# Patient Record
Sex: Female | Born: 2000 | Race: Black or African American | Hispanic: No | Smoking: Never smoker
Health system: Southern US, Community
[De-identification: ages and names within clinical notes are randomized; demographics above are authoritative.]

## PROBLEM LIST (undated history)

## (undated) DIAGNOSIS — R51 Headache: Secondary | ICD-10-CM

## (undated) DIAGNOSIS — R519 Headache, unspecified: Secondary | ICD-10-CM

## (undated) HISTORY — DX: Headache: R51

## (undated) HISTORY — DX: Headache, unspecified: R51.9

---
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
## 2019-02-10 DIAGNOSIS — Z113 Encounter for screening for infections with a predominantly sexual mode of transmission: Secondary | ICD-10-CM | POA: Diagnosis not present

## 2019-02-10 DIAGNOSIS — B373 Candidiasis of vulva and vagina: Secondary | ICD-10-CM | POA: Diagnosis not present

## 2019-03-08 DIAGNOSIS — F411 Generalized anxiety disorder: Secondary | ICD-10-CM | POA: Diagnosis not present

## 2019-04-22 DIAGNOSIS — Z20828 Contact with and (suspected) exposure to other viral communicable diseases: Secondary | ICD-10-CM | POA: Diagnosis not present

## 2019-04-28 DIAGNOSIS — Z01419 Encounter for gynecological examination (general) (routine) without abnormal findings: Secondary | ICD-10-CM | POA: Diagnosis not present

## 2019-05-03 ENCOUNTER — Ambulatory Visit
Admission: RE | Admit: 2019-05-03 | Discharge: 2019-05-03 | Disposition: A | Discharge status: Home / Self Care | Payer: BC Managed Care – PPO | Source: Ambulatory Visit | Attending: Internal Medicine | Admitting: Internal Medicine

## 2019-05-03 ENCOUNTER — Other Ambulatory Visit: Payer: Self-pay | Admitting: Internal Medicine

## 2019-05-03 DIAGNOSIS — R0781 Pleurodynia: Secondary | ICD-10-CM

## 2019-05-03 DIAGNOSIS — R079 Chest pain, unspecified: Secondary | ICD-10-CM | POA: Diagnosis not present

## 2019-05-10 DIAGNOSIS — R0789 Other chest pain: Secondary | ICD-10-CM | POA: Diagnosis not present

## 2019-05-22 DIAGNOSIS — F411 Generalized anxiety disorder: Secondary | ICD-10-CM | POA: Diagnosis not present

## 2019-06-07 DIAGNOSIS — F411 Generalized anxiety disorder: Secondary | ICD-10-CM | POA: Diagnosis not present

## 2020-07-01 IMAGING — DX DG CHEST 2V
2 series · 2 of 2 positions shown · non-contrast
Comparison: None.

CLINICAL DATA: 18-year-old female with a history of pleuritic chest
pain

EXAM:
CHEST - 2 VIEW

[dg chest 2 view (1 of 2)]
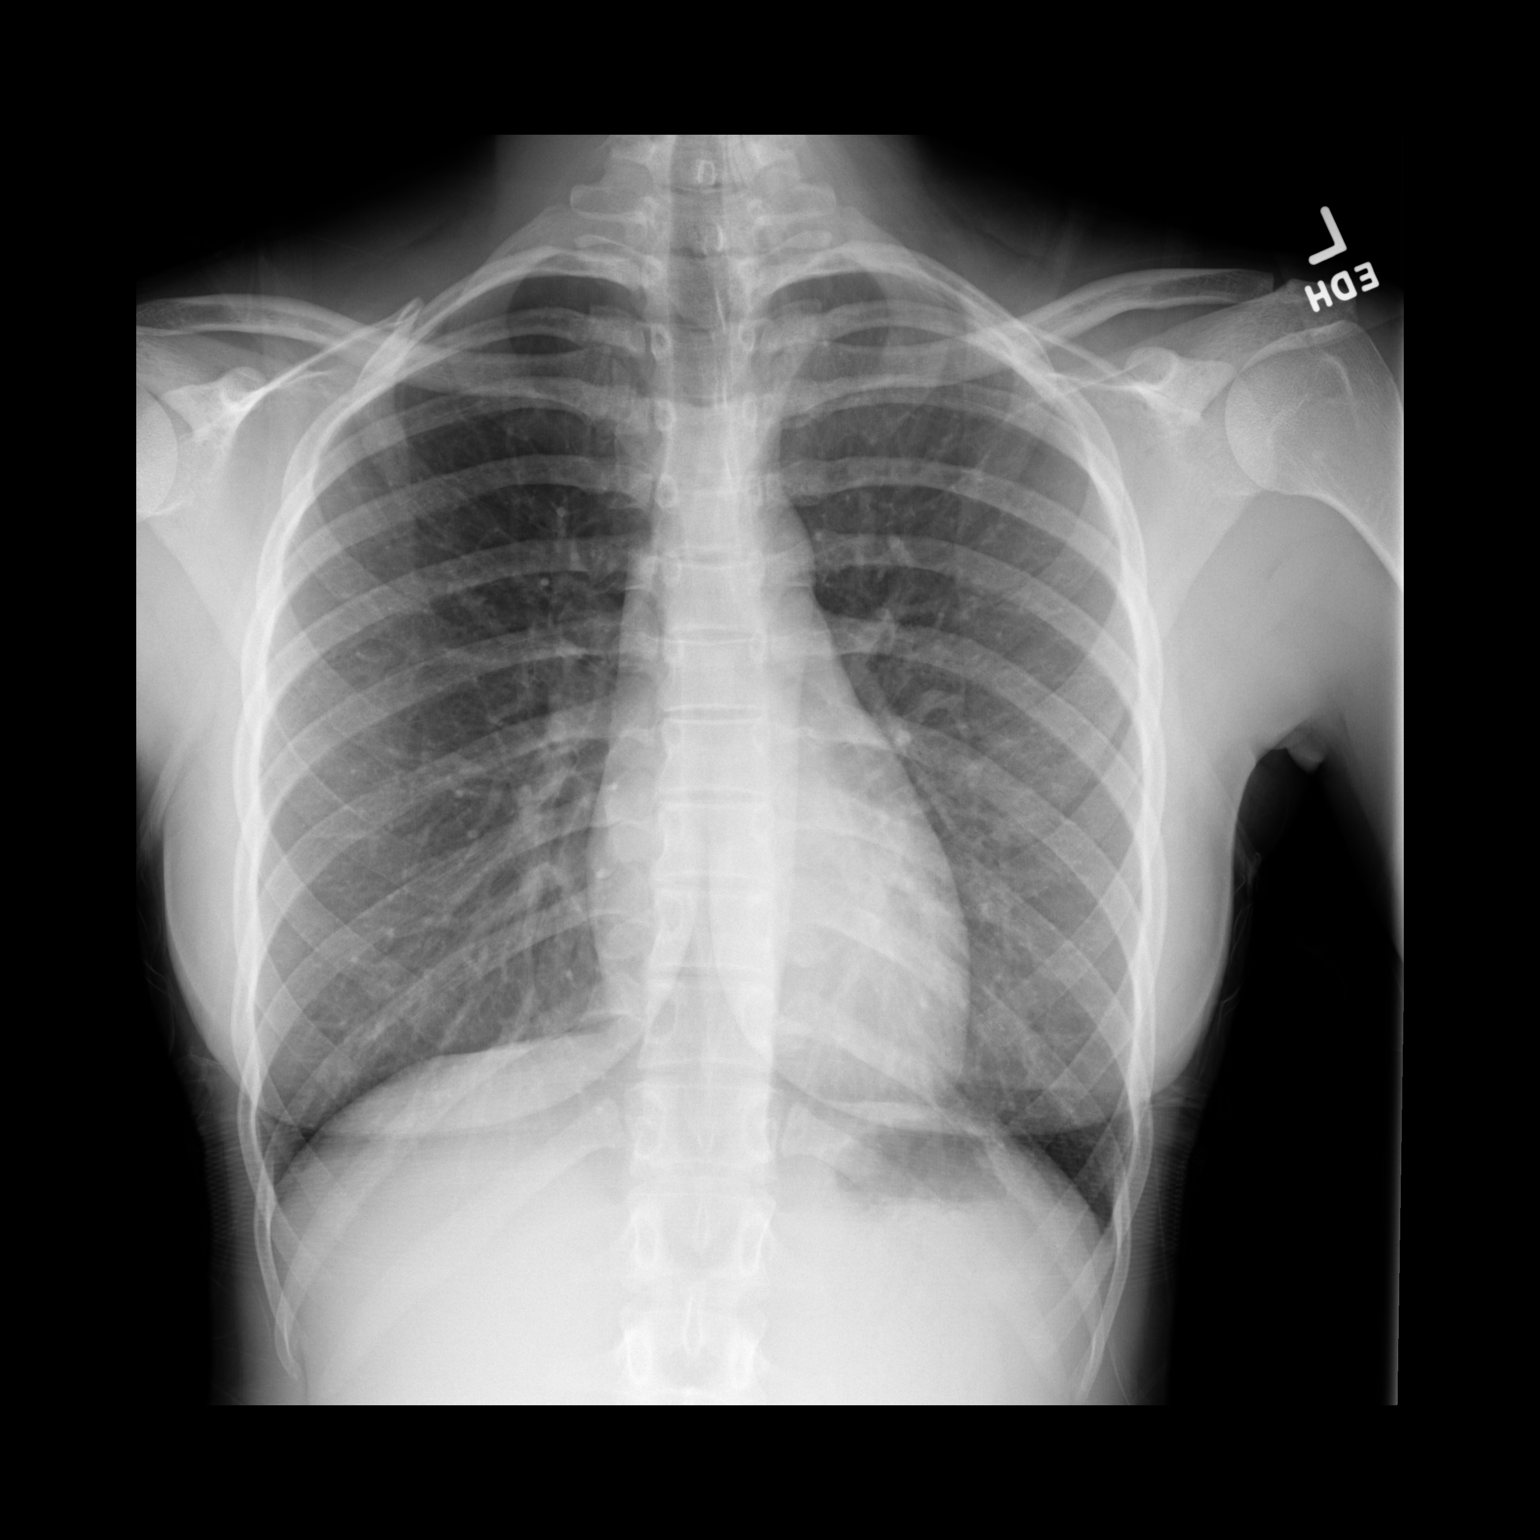

[dg chest 2 view (2 of 2)]
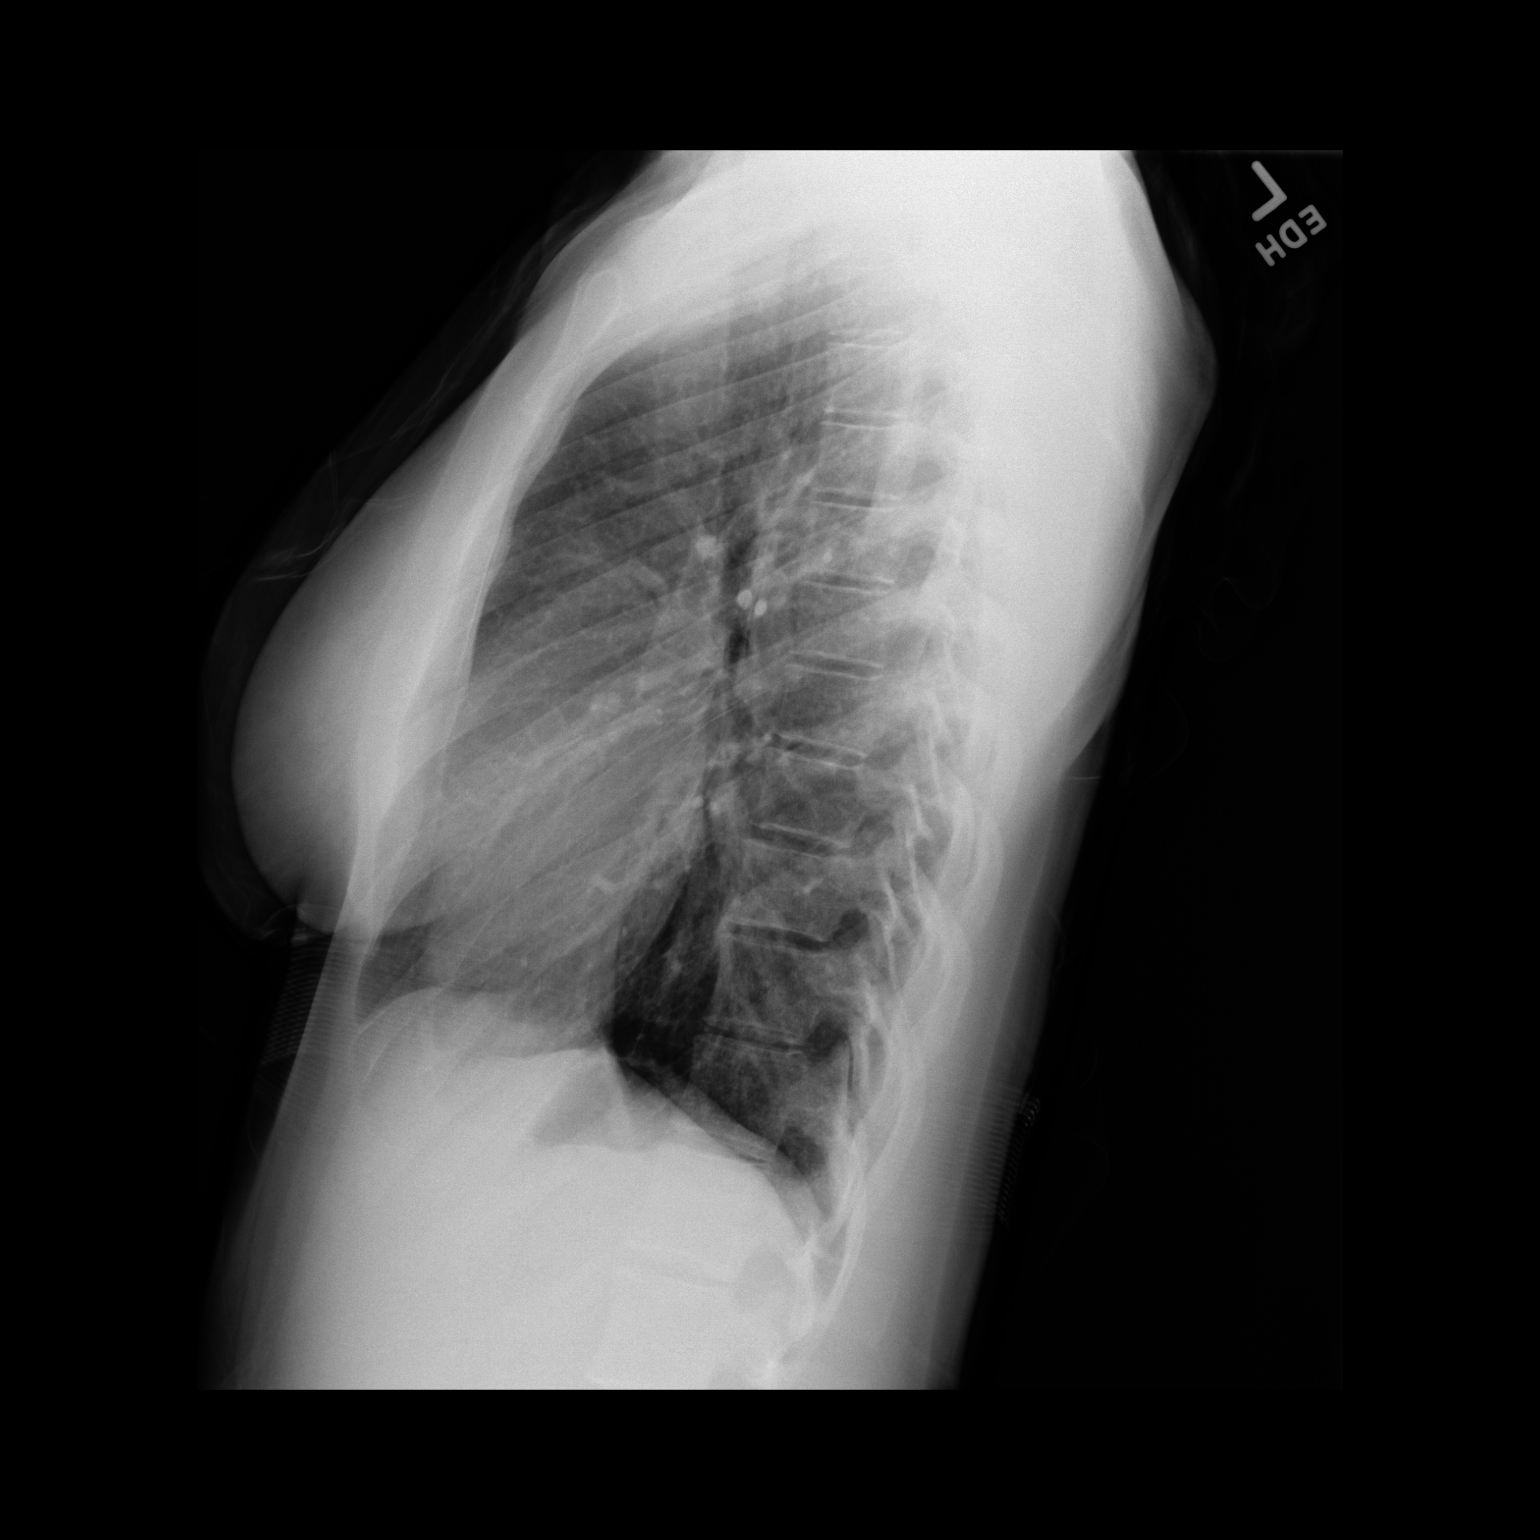

[2 of 2 positions shown; findings below may reference images not displayed]

FINDINGS: The heart size and mediastinal contours are within normal limits.
Both lungs are clear. The visualized skeletal structures are
unremarkable.
IMPRESSION: Negative for acute cardiopulmonary disease

## 2020-07-03 ENCOUNTER — Other Ambulatory Visit: Payer: BC Managed Care – PPO

## 2020-07-03 DIAGNOSIS — Z20822 Contact with and (suspected) exposure to covid-19: Secondary | ICD-10-CM | POA: Diagnosis not present

## 2020-07-04 LAB — SARS-COV-2, NAA 2 DAY TAT

## 2020-07-04 LAB — NOVEL CORONAVIRUS, NAA: SARS-CoV-2, NAA: NOT DETECTED

## 2020-07-10 DIAGNOSIS — Z113 Encounter for screening for infections with a predominantly sexual mode of transmission: Secondary | ICD-10-CM | POA: Diagnosis not present

## 2020-07-10 DIAGNOSIS — Z01419 Encounter for gynecological examination (general) (routine) without abnormal findings: Secondary | ICD-10-CM | POA: Diagnosis not present

## 2021-12-12 ENCOUNTER — Ambulatory Visit (HOSPITAL_COMMUNITY): Payer: Self-pay

## 2022-02-26 ENCOUNTER — Ambulatory Visit (HOSPITAL_COMMUNITY): Payer: Self-pay

## 2022-03-24 ENCOUNTER — Ambulatory Visit (HOSPITAL_COMMUNITY)
Admission: EM | Admit: 2022-03-24 | Discharge: 2022-03-24 | Disposition: A | Discharge status: Home / Self Care | Payer: Commercial Managed Care - HMO | Attending: Emergency Medicine | Admitting: Emergency Medicine

## 2022-03-24 ENCOUNTER — Encounter (HOSPITAL_COMMUNITY): Payer: Self-pay | Admitting: Emergency Medicine

## 2022-03-24 DIAGNOSIS — N898 Other specified noninflammatory disorders of vagina: Secondary | ICD-10-CM | POA: Diagnosis not present

## 2022-03-24 NOTE — ED Triage Notes (Signed)
Pt reports color is different than her normal vaginal discharge for couple weeks. Reports has a new sexual partner and wants STD testing.

## 2022-03-24 NOTE — Discharge Instructions (Signed)
Labs pending 2-3 days, you will be contacted if positive for any sti and treatment will be sent to the pharmacy, you will have to return to the clinic if positive for gonorrhea to receive treatment   Please refrain from having sex until labs results, if positive please refrain from having sex until treatment complete and symptoms resolve   If positive for  Chlamydia  gonorrhea or trichomoniasis please notify partner or partners so they may tested as well  Moving forward, it is recommended you use some form of protection against the transmission of sti infections  such as condoms or dental dams with each sexual encounter    

## 2022-03-24 NOTE — ED Provider Notes (Signed)
[facility]    CSN: [id number] Arrival date & time: 03/24/22  1611      History   Chief Complaint Chief Complaint  Patient presents with   Vaginal Discharge    HPI Bonnie Becker is a 21 y.o. female.   Patient presents with thick [place] vaginal discharge for 2 weeks, endorses this is a change from the normal presentation.  Symptoms started after unprotected sexual encounter with a new partner no known exposure.  Denies vaginal itching, odor, new rash or lesions, lower abdominal pain or pressure, flank pain, fever, chills or urinary symptoms.  Has not attempted treatment.    Past Medical History:  Diagnosis Date   Headache     Patient Active Problem List   Diagnosis Date Noted   Hair loss 03/27/2016   Chronic daily headache 07/03/2015    Past Surgical History:  Procedure Laterality Date   ADENOIDECTOMY AND MYRINGOTOMY WITH TUBE PLACEMENT  2005    OB History   No obstetric history on file.      Home Medications    Prior to Admission medications   Medication Sig Start Date End Date Taking? Authorizing Provider  acetaminophen (TYLENOL) 500 MG tablet Take 500 mg by mouth every 6 (six) hours as needed (Takes [phone] mg every 6 hour as needed.). Reported on 10/02/2015    [provider]  aspirin-acetaminophen-caffeine (EXCEDRIN MIGRAINE) [id number] MG tablet Take 1 tablet by mouth every 6 (six) hours as needed for headache (Taked 1-2 tabs every 6 hours as needed). Reported on 10/02/2015    [provider]  cetirizine (ZYRTEC) 10 MG tablet Take 10 mg by mouth daily as needed for allergies (Uses during allergy season only). Reported on 10/02/2015    [provider]  fluticasone (FLONASE) 50 MCG/ACT nasal spray Place 2 sprays into both nostrils daily as needed (Uses during allergy season only). Reported on 10/02/2015 01/02/15   [provider]  ibuprofen (ADVIL,MOTRIN) 200 MG tablet Take 600 mg by mouth every 6 (six) hours as needed.     [provider]  metroNIDAZOLE (FLAGYL) 500 MG tablet Take 1 tablet (500 mg total) by mouth 2 (two) times daily. 10/01/17   Moss, Amber, DO  promethazine (PHENERGAN) 12.5 MG tablet 1-2 tablets as needed every 6 hours Patient not taking: Reported on 03/20/2016 06/17/15   Elveria Rising, NP  propranolol ER (INDERAL LA) 60 MG 24 hr capsule Take 1 capsule (60 mg total) by mouth daily. 10/02/15   Margurite Auerbach, MD    Family History Family History  Problem Relation Age of Onset   Depression Maternal Grandmother    Anxiety disorder Maternal Grandmother    Congestive Heart Failure Paternal Grandmother     Social History Social History   Tobacco Use   Smoking status: Never   Smokeless tobacco: Never  Substance Use Topics   Alcohol use: No   Drug use: No     Allergies   Other   Review of Systems Review of Systems  Constitutional: Negative.   Respiratory: Negative.    Cardiovascular: Negative.   Genitourinary:  Positive for vaginal discharge. Negative for decreased urine volume, difficulty urinating, dyspareunia, dysuria, enuresis, flank pain, frequency, genital sores, hematuria, menstrual problem, pelvic pain, urgency, vaginal bleeding and vaginal pain.  Skin: Negative.      Physical Exam Triage Vital Signs ED Triage Vitals  Enc Vitals Group     BP 03/24/22 1637 116/72     Pulse Rate 03/24/22 1637 90  Resp 03/24/22 1637 17     Temp 03/24/22 1637 98 F (36.7 C)     Temp src --      SpO2 03/24/22 1637 99 %     Weight --      Height --      Head Circumference --      Peak Flow --      Pain Score 03/24/22 1636 0     Pain Loc --      Pain Edu? --      Excl. in [place]? --    No data found.  Updated Vital Signs BP 116/72 (BP Location: Left Arm)   Pulse 90   Temp 98 F (36.7 C)   Resp 17   LMP 03/02/2022 (Exact Date)   SpO2 99%   Visual Acuity Right Eye Distance:   Left Eye Distance:   Bilateral Distance:    Right Eye Near:   Left Eye Near:     Bilateral Near:     Physical Exam Constitutional:      Appearance: Normal appearance.  Eyes:     Extraocular Movements: Extraocular movements intact.  Pulmonary:     Effort: Pulmonary effort is normal.  Genitourinary:    Comments: deferred Skin:    General: Skin is warm and dry.  Neurological:     Mental Status: She is alert and oriented to person, place, and time. Mental status is at baseline.  Psychiatric:        Mood and Affect: Mood normal.        Behavior: Behavior normal.      UC Treatments / Results  Labs (all labs ordered are listed, but only abnormal results are displayed) Labs Reviewed  CERVICOVAGINAL ANCILLARY ONLY    EKG   Radiology No results found.  Procedures Procedures (including critical care time)  Medications Ordered in UC Medications - No data to display  Initial Impression / Assessment and Plan / UC Course  I have reviewed the triage vital signs and the nursing notes.  Pertinent labs & imaging results that were available during my care of the patient were reviewed by me and considered in my medical decision making (see chart for details).  Vaginal discharge   STI labs pending will treat per protocol, declined HIV and syphilis testing, advised abstinence until lab results, and/or treatment is complete, advised condom use during all sexual encounters moving, may follow-up with urgent care as needed  Final Clinical Impressions(s) / UC Diagnoses   Final diagnoses:  None   Discharge Instructions   None    ED Prescriptions   None    PDMP not reviewed this encounter.   Hans Eden, NP 03/24/22 1652

## 2022-03-25 LAB — CERVICOVAGINAL ANCILLARY ONLY
Bacterial Vaginitis (gardnerella): NEGATIVE
Candida Glabrata: NEGATIVE
Candida Vaginitis: POSITIVE — AB
Chlamydia: NEGATIVE
Comment: NEGATIVE
Comment: NEGATIVE
Comment: NEGATIVE
Comment: NEGATIVE
Comment: NEGATIVE
Comment: NORMAL
Neisseria Gonorrhea: NEGATIVE
Trichomonas: NEGATIVE

## 2022-03-26 ENCOUNTER — Telehealth (HOSPITAL_COMMUNITY): Payer: Self-pay | Admitting: Emergency Medicine

## 2022-03-26 MED ORDER — FLUCONAZOLE 150 MG PO TABS: 150.0000 mg | ORAL_TABLET | Freq: Once | ORAL | 0 refills | Status: AC

## 2022-06-13 ENCOUNTER — Ambulatory Visit (HOSPITAL_COMMUNITY): Payer: Self-pay

## 2022-07-07 ENCOUNTER — Encounter (HOSPITAL_COMMUNITY): Payer: Self-pay

## 2022-07-07 ENCOUNTER — Ambulatory Visit (HOSPITAL_COMMUNITY)
Admission: RE | Admit: 2022-07-07 | Discharge: 2022-07-07 | Disposition: A | Discharge status: Home / Self Care | Payer: Commercial Managed Care - HMO | Source: Ambulatory Visit | Attending: Physician Assistant | Admitting: Physician Assistant

## 2022-07-07 VITALS — BP 118/73 | HR 89 | Temp 98.7°F | Resp 18

## 2022-07-07 DIAGNOSIS — N898 Other specified noninflammatory disorders of vagina: Secondary | ICD-10-CM | POA: Diagnosis present

## 2022-07-07 DIAGNOSIS — Z113 Encounter for screening for infections with a predominantly sexual mode of transmission: Secondary | ICD-10-CM | POA: Diagnosis not present

## 2022-07-07 NOTE — Discharge Instructions (Signed)
Monitor your MyChart for your results.  We will contact you if we need to arrange any treatment.  Wear loosefitting cotton underwear and use hypoallergenic soaps and detergents.  If you have any additional or changing symptoms including pelvic pain, abdominal pain, fever, nausea, vomiting you need to be seen immediately.

## 2022-07-07 NOTE — ED Provider Notes (Signed)
[facility]    CSN: [id number] Arrival date & time: 07/07/22  1451      History   Chief Complaint Chief Complaint  Patient presents with   Vaginal Discharge   Vaginal Itching   SEXUALLY TRANSMITTED DISEASE    HPI Bonnie Becker is a 22 y.o. female.   Patient presents today requesting STI testing.  She reports that for the past several days she has had thick white vaginal discharge with associated itching.  She reached out to her OB/GYN who sent in a dose of Diflucan.  The itching has improved.  She has previously been treated with metronidazole and boric acid suppositories.  Denies any recent antibiotics, changes to personal hygiene products, soaps, detergents.  She is no specific concern for STI but is requesting testing to ensure that yeast is the cause of her symptoms.  She declined HIV/hepatitis/syphilis testing today.  Denies history of diabetes or immunosuppression.  She does not take SGLT2 inhibitor.    Past Medical History:  Diagnosis Date   Headache     Patient Active Problem List   Diagnosis Date Noted   Hair loss 03/27/2016   Chronic daily headache 07/03/2015    Past Surgical History:  Procedure Laterality Date   ADENOIDECTOMY AND MYRINGOTOMY WITH TUBE PLACEMENT  2005    OB History   No obstetric history on file.      Home Medications    Prior to Admission medications   Medication Sig Start Date End Date Taking? Authorizing Provider  fluconazole (DIFLUCAN) 150 MG tablet Take 150 mg by mouth every 3 (three) days. 07/06/22  Yes [provider]  JUNEL FE 1.5/30 1.5-30 MG-MCG tablet Take 1 tablet by mouth daily. 07/03/22  Yes [provider]    Family History Family History  Problem Relation Age of Onset   Depression Maternal Grandmother    Anxiety disorder Maternal Grandmother    Congestive Heart Failure Paternal Grandmother     Social History Social History   Tobacco Use   Smoking status: Never   Smokeless tobacco:  Never  Vaping Use   Vaping Use: Every day  Substance Use Topics   Alcohol use: No   Drug use: No     Allergies   Other   Review of Systems Review of Systems  Constitutional:  Negative for activity change, appetite change, fatigue and fever.  Gastrointestinal:  Negative for abdominal pain, diarrhea, nausea and vomiting.  Genitourinary:  Positive for vaginal discharge. Negative for dysuria, frequency, urgency, vaginal bleeding and vaginal pain.     Physical Exam Triage Vital Signs ED Triage Vitals  Enc Vitals Group     BP 07/07/22 1642 118/73     Pulse Rate 07/07/22 1642 89     Resp 07/07/22 1642 18     Temp 07/07/22 1642 98.7 F (37.1 C)     Temp Source 07/07/22 1642 Oral     SpO2 07/07/22 1642 97 %     Weight --      Height --      Head Circumference --      Peak Flow --      Pain Score 07/07/22 1640 0     Pain Loc --      Pain Edu? --      Excl. in [place]? --    No data found.  Updated Vital Signs BP 118/73 (BP Location: Left Arm)   Pulse 89   Temp 98.7 F (37.1 C) (Oral)   Resp  18   LMP 06/28/2022 (Exact Date)   SpO2 97%   Visual Acuity Right Eye Distance:   Left Eye Distance:   Bilateral Distance:    Right Eye Near:   Left Eye Near:    Bilateral Near:     Physical Exam Vitals reviewed.  Constitutional:      General: She is awake. She is not in acute distress.    Appearance: Normal appearance. She is well-developed. She is not ill-appearing.     Comments: Very pleasant female appears stated age in no acute distress sitting comfortably in exam room  HENT:     Head: Normocephalic and atraumatic.  Cardiovascular:     Rate and Rhythm: Normal rate and regular rhythm.     Heart sounds: Normal heart sounds, S1 normal and S2 normal. No murmur heard. Pulmonary:     Effort: Pulmonary effort is normal.     Breath sounds: Normal breath sounds. No wheezing, rhonchi or rales.     Comments: Clear to auscultation bilaterally Abdominal:     General: Bowel  sounds are normal.     Palpations: Abdomen is soft.     Tenderness: There is no abdominal tenderness. There is no right CVA tenderness, left CVA tenderness, guarding or rebound.     Comments: Benign abdominal exam  Psychiatric:        Behavior: Behavior is cooperative.      UC Treatments / Results  Labs (all labs ordered are listed, but only abnormal results are displayed) Labs Reviewed  CERVICOVAGINAL ANCILLARY ONLY    EKG   Radiology No results found.  Procedures Procedures (including critical care time)  Medications Ordered in UC Medications - No data to display  Initial Impression / Assessment and Plan / UC Course  I have reviewed the triage vital signs and the nursing notes.  Pertinent labs & imaging results that were available during my care of the patient were reviewed by me and considered in my medical decision making (see chart for details).     Patient is well-appearing, afebrile, nontoxic, nontachycardic.  She has already been treated for yeast.  STI swab was collected today and is pending.  If she has any additional positive results on her swab we will contact her to arrange treatment.  Encouraged her to use hypoallergenic soaps and detergents and wear loosefitting cotton underwear.  She is to follow-up regularly with her OB/GYN.  Discussed that if she has any changing or worsening symptoms including abdominal pain, pelvic pain, change in discharge, fever, nausea, vomiting she needs to be seen immediately.  Strict return precautions given.  Final Clinical Impressions(s) / UC Diagnoses   Final diagnoses:  Vaginal discharge  Routine screening for STI (sexually transmitted infection)     Discharge Instructions      Monitor your MyChart for your results.  We will contact you if we need to arrange any treatment.  Wear loosefitting cotton underwear and use hypoallergenic soaps and detergents.  If you have any additional or changing symptoms including pelvic  pain, abdominal pain, fever, nausea, vomiting you need to be seen immediately.     ED Prescriptions   None    PDMP not reviewed this encounter.   Jeani Hawking, PA-C 07/07/22 1714

## 2022-07-07 NOTE — ED Triage Notes (Signed)
Pt states that she has vaginal discharge and itching since Saturday. She called her gyn and she called her in diflucan for yeast infection she used yesterday. She has no know exposure but she would like STI testing.

## 2022-07-08 LAB — CERVICOVAGINAL ANCILLARY ONLY
Bacterial Vaginitis (gardnerella): NEGATIVE
Candida Glabrata: NEGATIVE
Candida Vaginitis: NEGATIVE
Chlamydia: NEGATIVE
Comment: NEGATIVE
Comment: NEGATIVE
Comment: NEGATIVE
Comment: NEGATIVE
Comment: NEGATIVE
Comment: NORMAL
Neisseria Gonorrhea: NEGATIVE
Trichomonas: NEGATIVE

## 2022-10-27 ENCOUNTER — Ambulatory Visit (HOSPITAL_COMMUNITY): Payer: Self-pay

## 2022-11-24 ENCOUNTER — Ambulatory Visit (HOSPITAL_COMMUNITY): Payer: Self-pay

## 2023-01-17 ENCOUNTER — Ambulatory Visit (HOSPITAL_COMMUNITY)
Admission: RE | Admit: 2023-01-17 | Discharge: 2023-01-17 | Disposition: A | Discharge status: Home / Self Care | Payer: Commercial Managed Care - HMO | Source: Ambulatory Visit | Attending: Family Medicine | Admitting: Family Medicine

## 2023-01-17 ENCOUNTER — Encounter (HOSPITAL_COMMUNITY): Payer: Self-pay

## 2023-01-17 VITALS — BP 112/75 | HR 75 | Temp 98.5°F | Resp 15

## 2023-01-17 DIAGNOSIS — N760 Acute vaginitis: Secondary | ICD-10-CM | POA: Diagnosis present

## 2023-01-17 DIAGNOSIS — Z202 Contact with and (suspected) exposure to infections with a predominantly sexual mode of transmission: Secondary | ICD-10-CM | POA: Diagnosis not present

## 2023-01-17 NOTE — ED Triage Notes (Signed)
Reports that started menstrual cycle this past Monday. Reports sometimes gets rash with pads. Put cream on rash and went away. Friday and yesterday had itching in vaginal area. Pt requesting STD testing.

## 2023-01-17 NOTE — ED Provider Notes (Signed)
[facility]    CSN: [id number] Arrival date & time: 01/17/23  1243      History   Chief Complaint Chief Complaint  Patient presents with   Exposure to STD    Entered by patient    HPI Bonnie Becker is a 22 y.o. female.   The history is provided by the patient. No language interpreter was used.  Exposure to STD This is a new (The patient had a sexual encounter with her ex-boyfriend one week ago and a few days later, after which she felt some irritation in her vulva.) problem. The problem occurs constantly. The problem has been rapidly improving. Pertinent negatives include no abdominal pain. Associated symptoms comments: She denies any symptoms at the moment. No vulva itching, burning, or vaginal discharge. She wants to be screened for STDs.. Relieved by: She used topical Monistat cream on the lesion, with some improvement.    Past Medical History:  Diagnosis Date   Headache     Patient Active Problem List   Diagnosis Date Noted   Hair loss 03/27/2016   Chronic daily headache 07/03/2015    Past Surgical History:  Procedure Laterality Date   ADENOIDECTOMY AND MYRINGOTOMY WITH TUBE PLACEMENT  2005    OB History   No obstetric history on file.      Home Medications    Prior to Admission medications   Medication Sig Start Date End Date Taking? Authorizing Provider  fluconazole (DIFLUCAN) 150 MG tablet Take 150 mg by mouth every 3 (three) days. 07/06/22   [provider]  JUNEL FE 1.5/30 1.5-30 MG-MCG tablet Take 1 tablet by mouth daily. 07/03/22   [provider]    Family History Family History  Problem Relation Age of Onset   Depression Maternal Grandmother    Anxiety disorder Maternal Grandmother    Congestive Heart Failure Paternal Grandmother     Social History Social History   Tobacco Use   Smoking status: Never   Smokeless tobacco: Never  Vaping Use   Vaping status: Every Day  Substance Use Topics   Alcohol use: No    Drug use: No     Allergies   Other   Review of Systems Review of Systems  Gastrointestinal:  Negative for abdominal pain.  All other systems reviewed and are negative.    Physical Exam Triage Vital Signs ED Triage Vitals [01/17/23 1310]  Encounter Vitals Group     BP 112/75     Systolic BP Percentile      Diastolic BP Percentile      Pulse Rate 75     Resp 15     Temp 98.5 F (36.9 C)     Temp Source Oral     SpO2 98 %     Weight      Height      Head Circumference      Peak Flow      Pain Score 0     Pain Loc      Pain Education      Exclude from Growth Chart    No data found.  Updated Vital Signs BP 112/75 (BP Location: Left Arm)   Pulse 75   Temp 98.5 F (36.9 C) (Oral)   Resp 15   LMP 01/11/2023   SpO2 98%   Visual Acuity Right Eye Distance:   Left Eye Distance:   Bilateral Distance:    Right Eye Near:   Left Eye Near:    Bilateral  Near:     Physical Exam Vitals and nursing note reviewed.  Cardiovascular:     Rate and Rhythm: Normal rate and regular rhythm.     Heart sounds: Normal heart sounds. No murmur heard. Pulmonary:     Effort: Pulmonary effort is normal. No respiratory distress.     Breath sounds: Normal breath sounds. No wheezing.  Genitourinary:    Comments: Declined GU exam She completed self swab     UC Treatments / Results  Labs (all labs ordered are listed, but only abnormal results are displayed) Labs Reviewed  CERVICOVAGINAL ANCILLARY ONLY    EKG   Radiology No results found.  Procedures Procedures (including critical care time)  Medications Ordered in UC Medications - No data to display  Initial Impression / Assessment and Plan / UC Course  I have reviewed the triage vital signs and the nursing notes.  Pertinent labs & imaging results that were available during my care of the patient were reviewed by me and considered in my medical decision making (see chart for details).     Vulvo-vaginitis: STD  screening requested and was completed - GC/Chlam, Trich, Herpes BV and Yeast also tested. HIV and RPR offered, but she declined. We will contact her with test results soon. Instructions on STD prevention was provided.    Final Clinical Impressions(s) / UC Diagnoses   Final diagnoses:  Possible exposure to STD  Vaginitis and vulvovaginitis     Discharge Instructions        It was nice seeing you today. I will contact you soon with all test results.      ED Prescriptions   None    PDMP not reviewed this encounter.   Doreene Eland, MD 01/17/23 1335

## 2023-01-17 NOTE — Discharge Instructions (Addendum)
   It was nice seeing you today. I will contact you soon with all test results.

## 2023-01-18 ENCOUNTER — Telehealth: Payer: Self-pay | Admitting: Family Medicine

## 2023-01-18 LAB — CERVICOVAGINAL ANCILLARY ONLY
Bacterial Vaginitis (gardnerella): NEGATIVE
Candida Glabrata: NEGATIVE
Candida Vaginitis: POSITIVE — AB
Chlamydia: NEGATIVE
Comment: NEGATIVE
Comment: NEGATIVE
Comment: NEGATIVE
Comment: NEGATIVE
Comment: NEGATIVE
Comment: NORMAL
Neisseria Gonorrhea: NEGATIVE
Trichomonas: NEGATIVE

## 2023-01-18 MED ORDER — FLUCONAZOLE 150 MG PO TABS: 150.0000 mg | ORAL_TABLET | Freq: Once | ORAL | 0 refills | Status: AC

## 2023-01-18 NOTE — Telephone Encounter (Signed)
Test result discussed.  Positive Candida infection - Diflucan escribed She has no further questions.

## 2023-02-03 ENCOUNTER — Emergency Department (HOSPITAL_COMMUNITY)
Admission: EM | Admit: 2023-02-03 | Discharge: 2023-02-03 | Disposition: A | Discharge status: Home / Self Care | Payer: Commercial Managed Care - HMO | Attending: Emergency Medicine | Admitting: Emergency Medicine

## 2023-02-03 DIAGNOSIS — J358 Other chronic diseases of tonsils and adenoids: Secondary | ICD-10-CM | POA: Insufficient documentation

## 2023-02-03 DIAGNOSIS — J029 Acute pharyngitis, unspecified: Secondary | ICD-10-CM | POA: Diagnosis present

## 2023-02-03 NOTE — ED Triage Notes (Signed)
Pt states that approximately 30 minutes ago she was trying to remove a tonsil stone and had some bleeding thereafter. Pt states she can still taste some blood, but no profuse bleeding noted. No airway issues. Pt denies pain.

## 2023-02-03 NOTE — Discharge Instructions (Addendum)
Thank you for allowing [place] to be a part of your care today.  You were evaluated for bleeding of your tonsil after attempting to remove a stone.   Your exam is overall reassuring.  Your left tonsil appears irritated, but is not swollen and is no longer bleeding.  I recommend not attempting to remove these using sharp objects, but you may try using a cotton swab.   I also recommend doing warm saltwater gargles a few times during the day to help with healing.    I have provided ENT referral information for follow up should you continue to have issues with your tonsils.   Return to the ED if you develop sudden worsening of your symptoms, have severe bleeding, difficulty swallowing, or if you have any new concerns.

## 2023-02-03 NOTE — ED Provider Notes (Signed)
[place] EMERGENCY DEPARTMENT AT [facility] Provider Note   CSN: [id number] Arrival date & time: 02/03/23  0518     History  Chief Complaint  Patient presents with   Sore Throat    Bonnie Becker is a 22 y.o. female presents to the ED with concern of a bleeding tonsil after attempting to remove a tonsil stone using a comb.  She states that she had some bleeding afterwards and became very worried.  Prior to ED arrival, she felt like she could still taste some blood.  She states she gets tonsil stones frequently on both sides.  Denies difficulty breathing, swallowing, or pain.         Home Medications Prior to Admission medications   Medication Sig Start Date End Date Taking? Authorizing Provider  JUNEL FE 1.5/30 1.5-30 MG-MCG tablet Take 1 tablet by mouth daily. 07/03/22   [provider]      Allergies    Other    Review of Systems   Review of Systems  HENT:  Negative for sore throat and trouble swallowing.   Respiratory:  Negative for choking and shortness of breath.     Physical Exam Updated Vital Signs BP 129/88 (BP Location: Left Arm)   Pulse (!) 110   Temp 98.7 F (37.1 C) (Oral)   Resp 16   LMP 01/11/2023   SpO2 100%  Physical Exam Vitals and nursing note reviewed.  Constitutional:      General: She is not in acute distress.    Appearance: Normal appearance. She is not ill-appearing or diaphoretic.  HENT:     Mouth/Throat:     Lips: Pink.     Mouth: Mucous membranes are moist.     Palate: No lesions.     Pharynx: Oropharynx is clear. Uvula midline. No pharyngeal swelling or posterior oropharyngeal erythema.     Tonsils: No tonsillar exudate or tonsillar abscesses.     Comments: Slightly enlarged blood vessel on the anterior surface of the left tonsil without active bleeding.  Tonsils do not appear enlarged.   Cardiovascular:     Rate and Rhythm: Normal rate and regular rhythm.  Pulmonary:     Effort: Pulmonary effort is normal.   Skin:    General: Skin is warm and dry.     Capillary Refill: Capillary refill takes less than 2 seconds.  Neurological:     Mental Status: She is alert. Mental status is at baseline.  Psychiatric:        Mood and Affect: Mood normal.        Behavior: Behavior normal.     ED Results / Procedures / Treatments   Labs (all labs ordered are listed, but only abnormal results are displayed) Labs Reviewed - No data to display  EKG None  Radiology No results found.  Procedures Procedures    Medications Ordered in ED Medications - No data to display  ED Course/ Medical Decision Making/ A&P                                 Medical Decision Making  This patient presents to the ED with chief complaint(s) of bleeding tonsil with pertinent past medical history of tonsil stones.  The complaint involves an extensive differential diagnosis and also carries with it a high risk of complications and morbidity.    Initial Assessment:   Slightly enlarged blood vessel on the anterior surface  of the left tonsil without active bleeding.  Tonsils do not appear enlarged. Patient is able to speak and swallow without difficulty.  Tonsil stone minimally visible.   Disposition:   Patient's exam is overall reassuring.  She does not have active bleeding.  Discussed supportive care measures including using warm saltwater gargles to help with symptoms.  Provided patient ENT referral.  Discussed not using sharp objects to attempt to remove tonsil stones to prevent further injury.   The patient has been appropriately medically screened and/or stabilized in the ED. I have low suspicion for any other emergent medical condition which would require further screening, evaluation or treatment in the ED or require inpatient management. At time of discharge the patient is hemodynamically stable and in no acute distress. I have discussed work-up results and diagnosis with patient and answered all questions. Patient is  agreeable with discharge plan. We discussed strict return precautions for returning to the emergency department and they verbalized understanding.            Final Clinical Impression(s) / ED Diagnoses Final diagnoses:  Tonsil stone    Rx / DC Orders ED Discharge Orders     None         Lenard Simmer, PA-C 02/03/23 0615    Sabas Sous, MD 02/03/23 [phone]

## 2023-02-16 ENCOUNTER — Other Ambulatory Visit: Payer: Self-pay | Admitting: Family Medicine

## 2023-03-24 ENCOUNTER — Emergency Department (HOSPITAL_COMMUNITY)
Admission: EM | Admit: 2023-03-24 | Discharge: 2023-03-25 | Disposition: A | Discharge status: Home / Self Care | Payer: Commercial Managed Care - HMO | Attending: Emergency Medicine | Admitting: Emergency Medicine

## 2023-03-24 DIAGNOSIS — Z5321 Procedure and treatment not carried out due to patient leaving prior to being seen by health care provider: Secondary | ICD-10-CM | POA: Diagnosis not present

## 2023-03-24 DIAGNOSIS — F410 Panic disorder [episodic paroxysmal anxiety] without agoraphobia: Secondary | ICD-10-CM | POA: Diagnosis present

## 2023-03-24 NOTE — ED Triage Notes (Signed)
Patient states she thinks maybe she had a panic attack, has had one in the past, and it kind of felt like that.

## 2023-03-24 NOTE — ED Triage Notes (Signed)
Patient states she was riding in a car as passenger and states she started feeling weird, complained of left side of face started feeling funny, no longer feels that way. Has a little bit of tingling around her face now. Also states she started having heart palpitations. Denies chest pain, headache,or any other pain. 0/10

## 2023-04-05 ENCOUNTER — Other Ambulatory Visit: Payer: Self-pay

## 2023-04-05 ENCOUNTER — Emergency Department (HOSPITAL_COMMUNITY)
Admission: EM | Admit: 2023-04-05 | Discharge: 2023-04-06 | Disposition: A | Discharge status: Home / Self Care | Payer: Commercial Managed Care - HMO | Attending: Emergency Medicine | Admitting: Emergency Medicine

## 2023-04-05 ENCOUNTER — Encounter (HOSPITAL_COMMUNITY): Payer: Self-pay

## 2023-04-05 ENCOUNTER — Emergency Department (HOSPITAL_COMMUNITY): Payer: Commercial Managed Care - HMO

## 2023-04-05 DIAGNOSIS — R002 Palpitations: Secondary | ICD-10-CM | POA: Diagnosis not present

## 2023-04-05 DIAGNOSIS — R079 Chest pain, unspecified: Secondary | ICD-10-CM | POA: Diagnosis present

## 2023-04-05 LAB — CBC
HCT: 42.5 % (ref 36.0–46.0)
Hemoglobin: 14.3 g/dL (ref 12.0–15.0)
MCH: 30.2 pg (ref 26.0–34.0)
MCHC: 33.6 g/dL (ref 30.0–36.0)
MCV: 89.9 fL (ref 80.0–100.0)
Platelets: 307 10*3/uL (ref 150–400)
RBC: 4.73 MIL/uL (ref 3.87–5.11)
RDW: 12.4 % (ref 11.5–15.5)
WBC: 8.7 10*3/uL (ref 4.0–10.5)
nRBC: 0 % (ref 0.0–0.2)

## 2023-04-05 LAB — BASIC METABOLIC PANEL
Anion gap: 10 (ref 5–15)
BUN: 12 mg/dL (ref 6–20)
CO2: 22 mmol/L (ref 22–32)
Calcium: 9.1 mg/dL (ref 8.9–10.3)
Chloride: 105 mmol/L (ref 98–111)
Creatinine, Ser: 1.05 mg/dL — ABNORMAL HIGH (ref 0.44–1.00)
GFR, Estimated: >60 mL/min (ref >60)
Glucose, Bld: 102 mg/dL — ABNORMAL HIGH (ref 70–99)
Potassium: 3.7 mmol/L (ref 3.5–5.1)
Sodium: 137 mmol/L (ref 135–145)

## 2023-04-05 LAB — D-DIMER, QUANTITATIVE: D-Dimer, Quant: 0.36 ug{FEU}/mL (ref 0.00–0.50)

## 2023-04-05 LAB — TROPONIN I (HIGH SENSITIVITY): Troponin I (High Sensitivity): <2 ng/L (ref <18)

## 2023-04-05 LAB — HCG, SERUM, QUALITATIVE: Preg, Serum: NEGATIVE

## 2023-04-05 NOTE — ED Provider Triage Note (Signed)
Emergency Medicine Provider Triage Evaluation Note  Bonnie Becker , a 22 y.o. female  was evaluated in triage.  Pt complains of chest pain.  Patient has had persistent chest pain for the past 2 and half hours with racing heart and left arm tightness.  She was evaluated for similar symptoms recently and returns for reevaluation today.  Review of Systems  Positive: Chest pain Negative: Vomiting or diaphoresis  Physical Exam  BP 129/76 (BP Location: Left Arm)   Pulse (!) 103   Temp 98.2 F (36.8 C) (Oral)   Resp 18   SpO2 99%  Gen:   Awake, no distress   Resp:  Normal effort  MSK:   Moves extremities without difficulty  Other:  tachycardic  Medical Decision Making  Medically screening exam initiated at 10:48 PM.  Appropriate orders placed.  Bonnie Becker was informed that the remainder of the evaluation will be completed by another provider, this initial triage assessment does not replace that evaluation, and the importance of remaining in the ED until their evaluation is complete.  On OCP D-dimer, tsh   Arthor Captain, PA-C 04/05/23 2255

## 2023-04-05 NOTE — ED Triage Notes (Signed)
Pt arrived from home via POV c./o left arm tightness that begins at shoulder to wrist. 6/10 on pain scale.

## 2023-04-06 LAB — TROPONIN I (HIGH SENSITIVITY): Troponin I (High Sensitivity): <2 ng/L (ref <18)

## 2023-04-06 LAB — TSH: TSH: 3.238 u[IU]/mL (ref 0.350–4.500)

## 2023-04-06 MED ORDER — HYDROXYZINE HCL 25 MG PO TABS: 25.0000 mg | ORAL_TABLET | Freq: Four times a day (QID) | ORAL | 0 refills | Status: DC | PRN

## 2023-04-06 NOTE — Discharge Instructions (Signed)
Take the prescribed medication as directed. °Follow-up with your primary care doctor. °Return to the ED for new or worsening symptoms. °

## 2023-04-06 NOTE — ED Provider Notes (Signed)
[place] EMERGENCY DEPARTMENT AT [facility] Provider Note   CSN: [id number] Arrival date & time: 04/05/23  2216     History  Chief Complaint  Patient presents with   Arm Pain    Bonnie Becker is a 22 y.o. female.  The history is provided by the patient and medical records.  Arm Pain Associated symptoms include chest pain.   22 year old female presenting to the ED with chest pain.  Reports she feels like she has been having some issues with anxiety, had a panic attack 2 weeks ago and was seen here.  States today she was driving home from work when she began to feel her heart racing and some pain in her left shoulder.  Pain did go down the arm a little bit.  She did report some transient shortness of breath but that seems to have resolved.  She denies any added stress recently.  She denies any prior cardiac history.  She is on OCPs.  She does vape as well.  No history of DVT or PE.  Patient does admit that she has fear of having further panic attacks.  Previously was on some anxiety medicine but this was many years ago and cannot recall what this was.  Home Medications Prior to Admission medications   Medication Sig Start Date End Date Taking? Authorizing Provider  JUNEL FE 1.5/30 1.5-30 MG-MCG tablet Take 1 tablet by mouth daily. 07/03/22   [provider]      Allergies    Other    Review of Systems   Review of Systems  Cardiovascular:  Positive for chest pain and palpitations.  All other systems reviewed and are negative.   Physical Exam Updated Vital Signs BP 110/81 (BP Location: Right Arm)   Pulse 82   Temp 98.3 F (36.8 C) (Oral)   Resp 19   LMP 04/05/2023 (Exact Date)   SpO2 100%   Physical Exam Vitals and nursing note reviewed.  Constitutional:      Appearance: She is well-developed.     Comments: Sleeping on stretcher with significant other, awoken for exam  HENT:     Head: Normocephalic and atraumatic.  Eyes:     Conjunctiva/sclera:  Conjunctivae normal.     Pupils: Pupils are equal, round, and reactive to light.  Cardiovascular:     Rate and Rhythm: Normal rate and regular rhythm.     Heart sounds: Normal heart sounds.  Pulmonary:     Effort: Pulmonary effort is normal. No respiratory distress.     Breath sounds: Normal breath sounds. No rhonchi.  Abdominal:     General: Bowel sounds are normal.     Palpations: Abdomen is soft.     Tenderness: There is no abdominal tenderness. There is no rebound.  Musculoskeletal:        General: Normal range of motion.     Cervical back: Normal range of motion.  Skin:    General: Skin is warm and dry.  Neurological:     Mental Status: She is alert and oriented to person, place, and time.     ED Results / Procedures / Treatments   Labs (all labs ordered are listed, but only abnormal results are displayed) Labs Reviewed  BASIC METABOLIC PANEL - Abnormal; Notable for the following components:      Result Value   Glucose, Bld 102 (*)    Creatinine, Ser 1.05 (*)    All other components within normal limits  CBC  D-DIMER,  QUANTITATIVE  HCG, SERUM, QUALITATIVE  TSH  TROPONIN I (HIGH SENSITIVITY)  TROPONIN I (HIGH SENSITIVITY)    EKG EKG Interpretation Date/Time:  Monday April 05 2023 22:30:24 EDT Ventricular Rate:  101 PR Interval:  126 QRS Duration:  72 QT Interval:  328 QTC Calculation: 426 R Axis:   87  Text Interpretation: Sinus tachycardia Confirmed by Drema Pry [id number]) on 04/05/2023 11:18:21 PM  Radiology DG Chest 2 View  Result Date: 04/05/2023 CLINICAL DATA:  Shortness of breath and chest pain EXAM: CHEST - 2 VIEW COMPARISON:  Radiographs 05/03/2019 FINDINGS: Normal cardiomediastinal silhouette. No focal consolidation, pleural effusion, or pneumothorax. No displaced rib fractures. IMPRESSION: No acute cardiopulmonary disease. Electronically Signed   By: Minerva Fester M.D.   On: 04/05/2023 23:41    Procedures Procedures    Medications Ordered  in ED Medications - No data to display  ED Course/ Medical Decision Making/ A&P                                 Medical Decision Making Amount and/or Complexity of Data Reviewed Radiology: ordered and independent interpretation performed. ECG/medicine tests: ordered and independent interpretation performed.  Risk Prescription drug management.   22 year old female presenting to the ED with chest pain and palpitations that began today while driving.  Does report some recent issues with anxiety, panic attack 2 weeks ago.  Not currently on any medications.  Initially tachycardic on arrival but normalized without intervention.  EKG without acute ischemic changes.  Labs reassuring--no leukocytosis or anemia.  No electrolyte derangement.  Troponin negative x 2.  D-dimer and TSH also negative.  Chest x-ray is clear.  Patient is resting comfortably with her boyfriend at this time.  Very low suspicion for ACS, PE, dissection or other acute cardiac event.  We had open discussion about her anxiety, it seems like she is having anxiety about potentially having further panic attacks.  We discussed options of an as needed medication, she is willing to consider this.  I have sent in a prescription to [facility]ine for pharmacy.  I recommended that she follow-up closely with her primary care doctor.  She can return here for any new or acute changes.  Final Clinical Impression(s) / ED Diagnoses Final diagnoses:  Palpitations    Rx / DC Orders ED Discharge Orders          Ordered    hydrOXYzine (ATARAX) 25 MG tablet  Every 6 hours PRN        04/06/23 0533              Garlon Hatchet, PA-C 04/06/23 0536    Nira Conn, MD 04/06/23 [phone]

## 2023-04-13 ENCOUNTER — Ambulatory Visit (HOSPITAL_COMMUNITY)
Admission: RE | Admit: 2023-04-13 | Discharge: 2023-04-13 | Disposition: A | Discharge status: Home / Self Care | Payer: Managed Care, Other (non HMO) | Source: Ambulatory Visit | Attending: Emergency Medicine | Admitting: Emergency Medicine

## 2023-04-13 ENCOUNTER — Encounter (HOSPITAL_COMMUNITY): Payer: Self-pay

## 2023-04-13 VITALS — BP 106/71 | Temp 98.1°F | Resp 18

## 2023-04-13 DIAGNOSIS — N898 Other specified noninflammatory disorders of vagina: Secondary | ICD-10-CM | POA: Diagnosis present

## 2023-04-13 NOTE — ED Provider Notes (Signed)
[facility]    CSN: [id number] Arrival date & time: 04/13/23  1255      History   Chief Complaint Chief Complaint  Patient presents with   SEXUALLY TRANSMITTED DISEASE    HPI Bonnie Becker is a 22 y.o. female.   Patient presents with odorous vaginal discharge that began today.  Patient declines STD testing at this time.  Denies abdominal pain, back pain, fever, vaginal bleeding, dysuria, and hematuria.     Past Medical History:  Diagnosis Date   Headache     Patient Active Problem List   Diagnosis Date Noted   Hair loss 03/27/2016   Chronic daily headache 07/03/2015    Past Surgical History:  Procedure Laterality Date   ADENOIDECTOMY AND MYRINGOTOMY WITH TUBE PLACEMENT  2005    OB History   No obstetric history on file.      Home Medications    Prior to Admission medications   Medication Sig Start Date End Date Taking? Authorizing Provider  JUNEL FE 1.5/30 1.5-30 MG-MCG tablet Take 1 tablet by mouth daily. 07/03/22  Yes [provider]  hydrOXYzine (ATARAX) 25 MG tablet Take 1 tablet (25 mg total) by mouth every 6 (six) hours as needed for anxiety. 04/06/23   Garlon Hatchet, PA-C    Family History Family History  Problem Relation Age of Onset   Depression Maternal Grandmother    Anxiety disorder Maternal Grandmother    Congestive Heart Failure Paternal Grandmother     Social History Social History   Tobacco Use   Smoking status: Never   Smokeless tobacco: Never  Vaping Use   Vaping status: Every Day  Substance Use Topics   Alcohol use: No   Drug use: No     Allergies   Other   Review of Systems Review of Systems  Gastrointestinal:  Negative for abdominal pain.  Genitourinary:  Positive for vaginal discharge. Negative for dysuria, flank pain, frequency, genital sores, hematuria, menstrual problem, pelvic pain, urgency, vaginal bleeding and vaginal pain.     Physical Exam Triage Vital Signs ED Triage Vitals   Encounter Vitals Group     BP 04/13/23 1315 106/71     Systolic BP Percentile --      Diastolic BP Percentile --      Pulse --      Resp 04/13/23 1315 18     Temp 04/13/23 1315 98.1 F (36.7 C)     Temp Source 04/13/23 1315 Oral     SpO2 04/13/23 1315 96 %     Weight --      Height --      Head Circumference --      Peak Flow --      Pain Score 04/13/23 1314 0     Pain Loc --      Pain Education --      Exclude from Growth Chart --    No data found.  Updated Vital Signs BP 106/71 (BP Location: Left Arm)   Temp 98.1 F (36.7 C) (Oral)   Resp 18   LMP 04/05/2023 (Exact Date)   SpO2 96%   Visual Acuity Right Eye Distance:   Left Eye Distance:   Bilateral Distance:    Right Eye Near:   Left Eye Near:    Bilateral Near:     Physical Exam Vitals and nursing note reviewed.  Constitutional:      General: She is awake. She is not in acute distress.  Appearance: Normal appearance. She is well-developed. She is not ill-appearing, toxic-appearing or diaphoretic.  Genitourinary:    Comments: GU exam deferred. Neurological:     Mental Status: She is alert.  Psychiatric:        Behavior: Behavior is cooperative.      UC Treatments / Results  Labs (all labs ordered are listed, but only abnormal results are displayed) Labs Reviewed  CERVICOVAGINAL ANCILLARY ONLY    EKG   Radiology No results found.  Procedures Procedures (including critical care time)  Medications Ordered in UC Medications - No data to display  Initial Impression / Assessment and Plan / UC Course  I have reviewed the triage vital signs and the nursing notes.  Pertinent labs & imaging results that were available during my care of the patient were reviewed by me and considered in my medical decision making (see chart for details).     Patient presented with odorous vaginal discharge that began today.  Declines STD testing at this time.  GU exam deferred.  Patient performed self swab for  STI.  Discussed follow-up and return precautions. Final Clinical Impressions(s) / UC Diagnoses   Final diagnoses:  Vaginal discharge     Discharge Instructions      Your results will come back over the next few days and someone will call you if results are positive and you require treatment.  Return here as needed.    ED Prescriptions   None    PDMP not reviewed this encounter.   Wynonia Lawman A, NP 04/13/23 1339

## 2023-04-13 NOTE — Discharge Instructions (Signed)
Your results will come back over the next few days and someone will call you if results are positive and you require treatment.  Return here as needed.

## 2023-04-13 NOTE — ED Triage Notes (Signed)
Pt states she would like STI testing she has vaginal odor and discharge starting today.

## 2023-04-15 ENCOUNTER — Telehealth: Payer: Self-pay

## 2023-04-15 LAB — CERVICOVAGINAL ANCILLARY ONLY
Bacterial Vaginitis (gardnerella): POSITIVE — AB
Candida Glabrata: NEGATIVE
Candida Vaginitis: NEGATIVE
Comment: NEGATIVE
Comment: NEGATIVE
Comment: NEGATIVE

## 2023-04-15 MED ORDER — METRONIDAZOLE 500 MG PO TABS: 500.0000 mg | ORAL_TABLET | Freq: Two times a day (BID) | ORAL | 0 refills | Status: DC

## 2023-05-24 ENCOUNTER — Ambulatory Visit (HOSPITAL_COMMUNITY)
Admission: RE | Admit: 2023-05-24 | Discharge: 2023-05-24 | Disposition: A | Discharge status: Home / Self Care | Payer: Commercial Managed Care - HMO | Source: Ambulatory Visit | Attending: Internal Medicine | Admitting: Internal Medicine

## 2023-05-24 ENCOUNTER — Encounter (HOSPITAL_COMMUNITY): Payer: Self-pay

## 2023-05-24 ENCOUNTER — Other Ambulatory Visit: Payer: Self-pay

## 2023-05-24 VITALS — BP 109/67 | HR 99 | Temp 98.7°F | Resp 16

## 2023-05-24 DIAGNOSIS — Z113 Encounter for screening for infections with a predominantly sexual mode of transmission: Secondary | ICD-10-CM

## 2023-05-24 DIAGNOSIS — N898 Other specified noninflammatory disorders of vagina: Secondary | ICD-10-CM

## 2023-05-24 NOTE — ED Provider Notes (Signed)
[facility]    CSN: [id number] Arrival date & time: 05/24/23  1341      History   Chief Complaint Chief Complaint  Patient presents with   Appointment    14:00   SEXUALLY TRANSMITTED DISEASE    HPI Bonnie Becker is a 22 y.o. female.   Patient presents with white, clumpy vaginal discharge that started about 2 days ago.  Denies exposure to STD but patient does report that she is sexually active and has had unprotected sexual intercourse.  Last menstrual cycle was 05/04/2023.  She does not use any other form of birth control.  Patient is not reporting any dysuria, urinary frequency, abdominal pain, pelvic pain, fever.     Past Medical History:  Diagnosis Date   Headache     Patient Active Problem List   Diagnosis Date Noted   Hair loss 03/27/2016   Chronic daily headache 07/03/2015    Past Surgical History:  Procedure Laterality Date   ADENOIDECTOMY AND MYRINGOTOMY WITH TUBE PLACEMENT  2005    OB History   No obstetric history on file.      Home Medications    Prior to Admission medications   Medication Sig Start Date End Date Taking? Authorizing Provider  hydrOXYzine (ATARAX) 25 MG tablet Take 1 tablet (25 mg total) by mouth every 6 (six) hours as needed for anxiety. Patient not taking: Reported on 05/24/2023 04/06/23   Garlon Hatchet, PA-C  JUNEL FE 1.5/30 1.5-30 MG-MCG tablet Take 1 tablet by mouth daily. 07/03/22   [provider]  metroNIDAZOLE (FLAGYL) 500 MG tablet Take 1 tablet (500 mg total) by mouth 2 (two) times daily. Patient not taking: Reported on 05/24/2023 04/15/23   Merrilee Jansky, MD    Family History Family History  Problem Relation Age of Onset   Depression Maternal Grandmother    Anxiety disorder Maternal Grandmother    Congestive Heart Failure Paternal Grandmother     Social History Social History   Tobacco Use   Smoking status: Never   Smokeless tobacco: Never  Vaping Use   Vaping status: Former   Substance Use Topics   Alcohol use: Yes   Drug use: No     Allergies   Other   Review of Systems Review of Systems Per HPI  Physical Exam Triage Vital Signs ED Triage Vitals  Encounter Vitals Group     BP 05/24/23 1405 109/67     Systolic BP Percentile --      Diastolic BP Percentile --      Pulse Rate 05/24/23 1405 99     Resp 05/24/23 1405 16     Temp 05/24/23 1405 98.7 F (37.1 C)     Temp Source 05/24/23 1405 Oral     SpO2 05/24/23 1405 97 %     Weight --      Height --      Head Circumference --      Peak Flow --      Pain Score 05/24/23 1402 0     Pain Loc --      Pain Education --      Exclude from Growth Chart --    No data found.  Updated Vital Signs BP 109/67 (BP Location: Left Arm)   Pulse 99   Temp 98.7 F (37.1 C) (Oral)   Resp 16   LMP 05/03/2023 (Approximate)   SpO2 97%   Visual Acuity Right Eye Distance:   Left Eye Distance:  Bilateral Distance:    Right Eye Near:   Left Eye Near:    Bilateral Near:     Physical Exam Constitutional:      General: She is not in acute distress.    Appearance: Normal appearance. She is not toxic-appearing or diaphoretic.  HENT:     Head: Normocephalic and atraumatic.  Eyes:     Extraocular Movements: Extraocular movements intact.     Conjunctiva/sclera: Conjunctivae normal.  Pulmonary:     Effort: Pulmonary effort is normal.  Genitourinary:    Comments: Deferred with shared decision making.  Self swab performed. Neurological:     General: No focal deficit present.     Mental Status: She is alert and oriented to person, place, and time. Mental status is at baseline.  Psychiatric:        Mood and Affect: Mood normal.        Behavior: Behavior normal.        Thought Content: Thought content normal.        Judgment: Judgment normal.      UC Treatments / Results  Labs (all labs ordered are listed, but only abnormal results are displayed) Labs Reviewed  CERVICOVAGINAL ANCILLARY ONLY     EKG   Radiology No results found.  Procedures Procedures (including critical care time)  Medications Ordered in UC Medications - No data to display  Initial Impression / Assessment and Plan / UC Course  I have reviewed the triage vital signs and the nursing notes.  Pertinent labs & imaging results that were available during my care of the patient were reviewed by me and considered in my medical decision making (see chart for details).     Cervicovaginal swab pending.  Given no confirmed exposure to STD, will await swab prior to treatment.  Advised strict follow-up precautions if symptoms persist or worsen.  Patient verbalized understanding and was agreeable with plan. Final Clinical Impressions(s) / UC Diagnoses   Final diagnoses:  Vaginal discharge  Screening examination for venereal disease     Discharge Instructions      Vaginal swab is pending.  We will call if it is positive and send any appropriate treatment.    ED Prescriptions   None    PDMP not reviewed this encounter.   Gustavus Bryant, [place] 05/24/23 [phone]

## 2023-05-24 NOTE — Discharge Instructions (Signed)
Vaginal swab is pending.  We will call if it is positive and send any appropriate treatment.

## 2023-05-24 NOTE — ED Triage Notes (Signed)
Reports itching, white, clumpy discharged.  Symptoms started Saturday.  Patient has used boric acid

## 2023-05-25 ENCOUNTER — Telehealth (HOSPITAL_COMMUNITY): Payer: Self-pay | Admitting: Emergency Medicine

## 2023-05-25 LAB — CERVICOVAGINAL ANCILLARY ONLY
Bacterial Vaginitis (gardnerella): NEGATIVE
Candida Glabrata: NEGATIVE
Candida Vaginitis: POSITIVE — AB
Chlamydia: POSITIVE — AB
Comment: NEGATIVE
Comment: NEGATIVE
Comment: NEGATIVE
Comment: NEGATIVE
Comment: NEGATIVE
Comment: NORMAL
Neisseria Gonorrhea: NEGATIVE
Trichomonas: NEGATIVE

## 2023-05-25 MED ORDER — DOXYCYCLINE HYCLATE 100 MG PO CAPS: 100.0000 mg | ORAL_CAPSULE | Freq: Two times a day (BID) | ORAL | 0 refills | Status: AC

## 2023-05-25 MED ORDER — FLUCONAZOLE 150 MG PO TABS: 150.0000 mg | ORAL_TABLET | Freq: Once | ORAL | 0 refills | Status: AC

## 2023-05-25 NOTE — Telephone Encounter (Signed)
Doxy for positive chlamydia, diflucan for positive yeast, per protocol

## 2023-05-25 NOTE — Telephone Encounter (Signed)
Opened in error

## 2023-07-15 ENCOUNTER — Telehealth: Payer: Commercial Managed Care - HMO | Admitting: Physician Assistant

## 2023-07-15 ENCOUNTER — Ambulatory Visit (HOSPITAL_COMMUNITY): Payer: Self-pay

## 2023-07-15 DIAGNOSIS — B3731 Acute candidiasis of vulva and vagina: Secondary | ICD-10-CM

## 2023-07-15 MED ORDER — FLUCONAZOLE 150 MG PO TABS: 150.0000 mg | ORAL_TABLET | Freq: Once | ORAL | 0 refills | Status: AC

## 2023-07-15 NOTE — Progress Notes (Signed)
I have spent 5 minutes in review of e-visit questionnaire, review and updating patient chart, medical decision making and response to patient.   Mia Milan Cody Jacklynn Dehaas, PA-C    

## 2023-07-15 NOTE — Progress Notes (Signed)

## 2023-07-16 ENCOUNTER — Encounter (HOSPITAL_COMMUNITY): Payer: Self-pay

## 2023-07-16 ENCOUNTER — Ambulatory Visit (HOSPITAL_COMMUNITY)
Admission: RE | Admit: 2023-07-16 | Discharge: 2023-07-16 | Disposition: A | Discharge status: Home / Self Care | Payer: Commercial Managed Care - HMO | Source: Ambulatory Visit | Attending: Internal Medicine | Admitting: Internal Medicine

## 2023-07-16 VITALS — BP 113/71 | HR 99 | Temp 98.3°F | Resp 16 | Ht 61.0 in | Wt 120.0 lb

## 2023-07-16 DIAGNOSIS — B3731 Acute candidiasis of vulva and vagina: Secondary | ICD-10-CM | POA: Insufficient documentation

## 2023-07-16 DIAGNOSIS — Z113 Encounter for screening for infections with a predominantly sexual mode of transmission: Secondary | ICD-10-CM | POA: Insufficient documentation

## 2023-07-16 MED ORDER — FLUCONAZOLE 150 MG PO TABS: 150.0000 mg | ORAL_TABLET | Freq: Every day | ORAL | 0 refills | Status: AC

## 2023-07-16 NOTE — ED Triage Notes (Signed)
Patient states after sleeping with her partner she is having yeast like symptoms. Patient would like to test for everything in the swab, no blood work.  Onset of symptoms Wednesday with it getting worse last night. Having vaginal itching and discharge.

## 2023-07-16 NOTE — ED Provider Notes (Signed)
[facility]    CSN: [id number] Arrival date & time: 07/16/23  1557      History   Chief Complaint Chief Complaint  Patient presents with   Appointment   Vaginal Discharge    HPI Bonnie Becker is a 23 y.o. female.   23 y.o. female who presents to urgent care with complaints of vaginal discharge.  This started on Wednesday.  The discharge is very thick and Rechy Bost.  There is no significant odor.  She is having some itching as well.  She has had similar symptoms when she has had a yeast infection in the past.  This did start after she had intercourse with her current partner.  She has been with this partner for quite some time now and he is not having any symptoms.  She denies any lower abdominal pain, dysuria, hematuria.   Vaginal Discharge Associated symptoms: no abdominal pain, no dysuria, no fever and no vomiting     Past Medical History:  Diagnosis Date   Headache     Patient Active Problem List   Diagnosis Date Noted   Hair loss 03/27/2016   Chronic daily headache 07/03/2015    Past Surgical History:  Procedure Laterality Date   ADENOIDECTOMY AND MYRINGOTOMY WITH TUBE PLACEMENT  2005    OB History   No obstetric history on file.      Home Medications    Prior to Admission medications   Medication Sig Start Date End Date Taking? Authorizing Provider  JUNEL FE 1.5/30 1.5-30 MG-MCG tablet Take 1 tablet by mouth daily. 07/03/22  Yes [provider]    Family History Family History  Problem Relation Age of Onset   Depression Maternal Grandmother    Anxiety disorder Maternal Grandmother    Congestive Heart Failure Paternal Grandmother     Social History Social History   Tobacco Use   Smoking status: Never   Smokeless tobacco: Never  Vaping Use   Vaping status: Former  Substance Use Topics   Alcohol use: Yes   Drug use: No     Allergies   Other   Review of Systems Review of Systems  Constitutional:  Negative for chills and  fever.  HENT:  Negative for ear pain and sore throat.   Eyes:  Negative for pain and visual disturbance.  Respiratory:  Negative for cough and shortness of breath.   Cardiovascular:  Negative for chest pain and palpitations.  Gastrointestinal:  Negative for abdominal pain and vomiting.  Genitourinary:  Positive for vaginal discharge. Negative for dysuria and hematuria.  Musculoskeletal:  Negative for arthralgias and back pain.  Skin:  Negative for color change and rash.  Neurological:  Negative for seizures and syncope.  All other systems reviewed and are negative.    Physical Exam Triage Vital Signs ED Triage Vitals  Encounter Vitals Group     BP 07/16/23 1701 113/71     Systolic BP Percentile --      Diastolic BP Percentile --      Pulse Rate 07/16/23 1701 99     Resp 07/16/23 1701 16     Temp 07/16/23 1701 98.3 F (36.8 C)     Temp Source 07/16/23 1701 Oral     SpO2 07/16/23 1701 98 %     Weight 07/16/23 1700 120 lb (54.4 kg)     Height 07/16/23 1700 5\' 1"  (1.549 m)     Head Circumference --      Peak Flow --  Pain Score 07/16/23 1659 0     Pain Loc --      Pain Education --      Exclude from Growth Chart --    No data found.  Updated Vital Signs BP 113/71 (BP Location: Left Arm)   Pulse 99   Temp 98.3 F (36.8 C) (Oral)   Resp 16   Ht 5\' 1"  (1.549 m)   Wt 120 lb (54.4 kg)   LMP 06/28/2023 (Approximate)   SpO2 98%   BMI 22.67 kg/m   Visual Acuity Right Eye Distance:   Left Eye Distance:   Bilateral Distance:    Right Eye Near:   Left Eye Near:    Bilateral Near:     Physical Exam Vitals and nursing note reviewed.  Constitutional:      General: She is not in acute distress.    Appearance: She is well-developed.  HENT:     Head: Normocephalic and atraumatic.  Eyes:     Conjunctiva/sclera: Conjunctivae normal.  Cardiovascular:     Rate and Rhythm: Normal rate and regular rhythm.     Heart sounds: No murmur heard. Pulmonary:     Effort:  Pulmonary effort is normal. No respiratory distress.     Breath sounds: Normal breath sounds.  Abdominal:     Palpations: Abdomen is soft.     Tenderness: There is no abdominal tenderness.  Musculoskeletal:        General: No swelling.     Cervical back: Neck supple.  Skin:    General: Skin is warm and dry.     Capillary Refill: Capillary refill takes less than 2 seconds.  Neurological:     Mental Status: She is alert.  Psychiatric:        Mood and Affect: Mood normal.      UC Treatments / Results  Labs (all labs ordered are listed, but only abnormal results are displayed) Labs Reviewed - No data to display  EKG   Radiology No results found.  Procedures Procedures (including critical care time)  Medications Ordered in UC Medications - No data to display  Initial Impression / Assessment and Plan / UC Course  I have reviewed the triage vital signs and the nursing notes.  Pertinent labs & imaging results that were available during my care of the patient were reviewed by me and considered in my medical decision making (see chart for details).     Vaginal yeast infection  Screening examination for STI   Symptoms most consistent with a vaginal yeast infection. We will treat with the following:  Diflucan 150 mg take 1 tablet now and then repeat in 3 days. Screening swab done today and results will be available in 24-48 hours. We will contact you if we need to arrange additional treatment based on your testing. Negative results will be on your MyChart account.   Return to urgent care or PCP if symptoms worsen or fail to resolve.    Final Clinical Impressions(s) / UC Diagnoses   Final diagnoses:  None   Discharge Instructions   None    ED Prescriptions   None    PDMP not reviewed this encounter.   Landis Martins, [place] 07/16/23 1723

## 2023-07-16 NOTE — Discharge Instructions (Addendum)
Symptoms most consistent with a vaginal yeast infection. We will treat with the following:  Diflucan 150 mg take 1 tablet now and then repeat in 3 days. Screening swab done today and results will be available in 24-48 hours. We will contact you if we need to arrange additional treatment based on your testing. Negative results will be on your MyChart account.   Return to urgent care or PCP if symptoms worsen or fail to resolve.

## 2023-07-19 ENCOUNTER — Telehealth (HOSPITAL_COMMUNITY): Payer: Self-pay

## 2023-07-19 LAB — CERVICOVAGINAL ANCILLARY ONLY
Bacterial Vaginitis (gardnerella): NEGATIVE
Candida Glabrata: NEGATIVE
Candida Vaginitis: POSITIVE — AB
Chlamydia: NEGATIVE
Comment: NEGATIVE
Comment: NEGATIVE
Comment: NEGATIVE
Comment: NEGATIVE
Comment: NEGATIVE
Comment: NORMAL
Neisseria Gonorrhea: NEGATIVE
Trichomonas: NEGATIVE

## 2023-07-19 MED ORDER — FLUCONAZOLE 150 MG PO TABS: 150.0000 mg | ORAL_TABLET | Freq: Once | ORAL | 0 refills | Status: AC

## 2023-07-19 NOTE — Telephone Encounter (Signed)
Per protocol, pt requires tx with Diflucan.  Rx sent to pharmacy on file.

## 2023-08-01 ENCOUNTER — Encounter (HOSPITAL_COMMUNITY): Payer: Self-pay

## 2023-08-01 ENCOUNTER — Telehealth (HOSPITAL_COMMUNITY): Payer: Self-pay

## 2023-08-01 ENCOUNTER — Ambulatory Visit (HOSPITAL_COMMUNITY)
Admission: EM | Admit: 2023-08-01 | Discharge: 2023-08-01 | Disposition: A | Discharge status: Home / Self Care | Payer: Commercial Managed Care - HMO | Attending: Emergency Medicine | Admitting: Emergency Medicine

## 2023-08-01 DIAGNOSIS — J111 Influenza due to unidentified influenza virus with other respiratory manifestations: Secondary | ICD-10-CM

## 2023-08-01 DIAGNOSIS — Z20828 Contact with and (suspected) exposure to other viral communicable diseases: Secondary | ICD-10-CM | POA: Diagnosis not present

## 2023-08-01 MED ORDER — OSELTAMIVIR PHOSPHATE 75 MG PO CAPS: 75.0000 mg | ORAL_CAPSULE | Freq: Two times a day (BID) | ORAL | 0 refills | Status: DC

## 2023-08-01 NOTE — Discharge Instructions (Addendum)
Her symptoms are consistent with a viral illness, most likely influenza.  Alternate between and her milligrams of ibuprofen and 500 mg of Tylenol every 4-6 hours for fever, body aches, headache and chills.  Mucinex 1200 mg can help with any congestion.  He can use over-the-counter cough medicine to help control your cough.  The Tamiflu may help you feel better sooner, it has been on backorder at some of the local pharmacies.  Return to clinic for any new or urgent symptoms or if no improvement over the next 5 to 7 days.

## 2023-08-01 NOTE — ED Triage Notes (Signed)
Patient having body aches, feeling feverish, slight throat/upper chest discomfort. Patient denies any cough or runny nose. Onset this morning. Patient states her sister and nephew are sick, her nephew was positive for flu A.   Patient took Excedrin migraine for her headache earlier with moderate relief.

## 2023-08-01 NOTE — Telephone Encounter (Signed)
Called pt and confirmed identity with 3 pt identifiers. Informed her that she had been prescribed Tamilfu and it was being sent to the pharmacy she had asked for. Pt verbalized understanding.

## 2023-08-01 NOTE — ED Provider Notes (Signed)
[facility]    CSN: [id number] Arrival date & time: 08/01/23  1305      History   Chief Complaint Chief Complaint  Patient presents with   Generalized Body Aches    HPI Bonnie Becker is a 23 y.o. female.   Patient presents to clinic for complaint of body aches, hot and cold chills, nasal congestion, sore throat and a cough.  Symptoms started this morning.  Her sister and nephew are sick, nephew positive for influenza A.  She was having a headache earlier today and she took Excedrin Migraine, this helped.  No wheezing or shortness of breath.  No vomiting or diarrhea.  The history is provided by the patient and medical records.    Past Medical History:  Diagnosis Date   Headache     Patient Active Problem List   Diagnosis Date Noted   Hair loss 03/27/2016   Chronic daily headache 07/03/2015    Past Surgical History:  Procedure Laterality Date   ADENOIDECTOMY AND MYRINGOTOMY WITH TUBE PLACEMENT  2005    OB History   No obstetric history on file.      Home Medications    Prior to Admission medications   Medication Sig Start Date End Date Taking? Authorizing Provider  JUNEL FE 1.5/30 1.5-30 MG-MCG tablet Take 1 tablet by mouth daily. 07/03/22  Yes [provider]  oseltamivir (TAMIFLU) 75 MG capsule Take 1 capsule (75 mg total) by mouth every 12 (twelve) hours. 08/01/23  Yes Murat Rideout, [place] N, FNP    Family History Family History  Problem Relation Age of Onset   Depression Maternal Grandmother    Anxiety disorder Maternal Grandmother    Congestive Heart Failure Paternal Grandmother     Social History Social History   Tobacco Use   Smoking status: Never   Smokeless tobacco: Never  Vaping Use   Vaping status: Former  Substance Use Topics   Alcohol use: Yes   Drug use: No     Allergies   Other   Review of Systems Review of Systems  Per HPI   Physical Exam Triage Vital Signs ED Triage Vitals  Encounter Vitals Group      BP 08/01/23 1348 109/73     Systolic BP Percentile --      Diastolic BP Percentile --      Pulse Rate 08/01/23 1348 (!) 112     Resp 08/01/23 1348 16     Temp 08/01/23 1348 98.4 F (36.9 C)     Temp Source 08/01/23 1348 Oral     SpO2 08/01/23 1348 98 %     Weight 08/01/23 1347 125 lb (56.7 kg)     Height 08/01/23 1347 5\' 1"  (1.549 m)     Head Circumference --      Peak Flow --      Pain Score 08/01/23 1346 0     Pain Loc --      Pain Education --      Exclude from Growth Chart --    No data found.  Updated Vital Signs BP 109/73 (BP Location: Left Arm)   Pulse (!) 112   Temp 98.4 F (36.9 C) (Oral)   Resp 16   Ht 5\' 1"  (1.549 m)   Wt 125 lb (56.7 kg)   LMP 07/26/2023 (Exact Date)   SpO2 98%   BMI 23.62 kg/m   Visual Acuity Right Eye Distance:   Left Eye Distance:   Bilateral Distance:    Right  Eye Near:   Left Eye Near:    Bilateral Near:     Physical Exam Vitals and nursing note reviewed.  Constitutional:      Appearance: Normal appearance.  HENT:     Head: Normocephalic and atraumatic.     Right Ear: External ear normal.     Left Ear: External ear normal.     Nose: Congestion and rhinorrhea present.     Mouth/Throat:     Mouth: Mucous membranes are moist.     Pharynx: Posterior oropharyngeal erythema present.  Eyes:     Conjunctiva/sclera: Conjunctivae normal.  Cardiovascular:     Rate and Rhythm: Normal rate and regular rhythm.     Heart sounds: Normal heart sounds. No murmur heard. Pulmonary:     Effort: Pulmonary effort is normal. No respiratory distress.     Breath sounds: Normal breath sounds.  Musculoskeletal:        General: Normal range of motion.  Skin:    General: Skin is warm and dry.  Neurological:     General: No focal deficit present.     Mental Status: She is alert and oriented to person, place, and time.  Psychiatric:        Mood and Affect: Mood normal.        Behavior: Behavior normal.      UC Treatments / Results   Labs (all labs ordered are listed, but only abnormal results are displayed) Labs Reviewed - No data to display  EKG   Radiology No results found.  Procedures Procedures (including critical care time)  Medications Ordered in UC Medications - No data to display  Initial Impression / Assessment and Plan / UC Course  I have reviewed the triage vital signs and the nursing notes.  Pertinent labs & imaging results that were available during my care of the patient were reviewed by me and considered in my medical decision making (see chart for details).  Vitals and triage reviewed, patient is hemodynamically stable.  Lungs are vesicular, heart with regular rate and rhythm.  Congestion, rhinorrhea and posterior pharynx erythema present on physical exam.  Recent exposure to influenza and symptoms consistent with viral URI, will send in Tamiflu.  Discussed at local pharmacies this is on backorder and symptomatic management for viral illness.  Plan of care, follow-up care return precautions given, no questions at this time.  Work note provided.     Final Clinical Impressions(s) / UC Diagnoses   Final diagnoses:  Influenza-like illness  Exposure to influenza     Discharge Instructions      Her symptoms are consistent with a viral illness, most likely influenza.  Alternate between and her milligrams of ibuprofen and 500 mg of Tylenol every 4-6 hours for fever, body aches, headache and chills.  Mucinex 1200 mg can help with any congestion.  He can use over-the-counter cough medicine to help control your cough.  The Tamiflu may help you feel better sooner, it has been on backorder at some of the local pharmacies.  Return to clinic for any new or urgent symptoms or if no improvement over the next 5 to 7 days.     ED Prescriptions     Medication Sig Dispense Auth. Provider   oseltamivir (TAMIFLU) 75 MG capsule Take 1 capsule (75 mg total) by mouth every 12 (twelve) hours. 10 capsule  Talasia Saulter, [place], FNP      PDMP not reviewed this encounter.   Daeshaun Specht, [place], [place] 08/01/23 [phone]

## 2023-09-08 ENCOUNTER — Ambulatory Visit (HOSPITAL_COMMUNITY): Payer: Self-pay

## 2023-09-08 ENCOUNTER — Ambulatory Visit (HOSPITAL_COMMUNITY): Admission: EM | Admit: 2023-09-08 | Discharge: 2023-09-08 | Disposition: A | Discharge status: Home / Self Care

## 2023-09-08 ENCOUNTER — Encounter (HOSPITAL_COMMUNITY): Payer: Self-pay

## 2023-09-08 DIAGNOSIS — N760 Acute vaginitis: Secondary | ICD-10-CM | POA: Insufficient documentation

## 2023-09-08 MED ORDER — FLUCONAZOLE 150 MG PO TABS: 150.0000 mg | ORAL_TABLET | Freq: Every day | ORAL | 0 refills | Status: DC

## 2023-09-08 NOTE — Discharge Instructions (Addendum)
 Vaginal irritation (suspected vaginal Candida) -Vaginal swab collected and sent to lab testing for bacterial vaginosis, vaginal Candida, trichomonas, gonorrhea/chlamydia. -As discussed based on presentation and report of past symptoms of vaginal Candida, I suspect that symptoms are a result of yeast infection. -If symptoms persist and testing is positive take fluconazole 150 mg tablet once daily on day 1 followed by a second tablet on day 3 if symptoms persist. -If there are any change in symptoms or escalation of current symptoms follow-up with gynecology for further evaluation and management.

## 2023-09-08 NOTE — ED Provider Notes (Signed)
 [facility]  Note:  This document was prepared using Dragon voice recognition software and may include unintentional dictation errors.  MRN: [id number] DOB: Oct 31, 2000  Subjective:   Bonnie Becker is a 23 y.o. female presenting for vaginal itching and irritation x 2 days.  Patient reports past history of vaginal Candida infections with similar presentation to current symptoms.  Patient reports that she took a dose of Diflucan 150 mg that she had leftover from a previous yeast infection.  Patient reports that she took medication yesterday but wanted to come to urgent care for evaluation to make sure that that is what her diagnosis is.  Patient denies any severe fight vaginal discharge, vaginal pain, pelvic pain, abdominal pain, dysuria, increased urinary frequency, flank pain.  Patient would also like STD testing for gonorrhea and chlamydia, trichomonas, bacterial vaginosis in conjunction with vaginal Candida testing.  No current facility-administered medications for this encounter.  Current Outpatient Medications:    fluconazole (DIFLUCAN) 150 MG tablet, Take 1 tablet (150 mg total) by mouth daily. Take second tablet if symptoms persist at day 3 after treatment initiation., Disp: 2 tablet, Rfl: 0   JUNEL FE 1.5/30 1.5-30 MG-MCG tablet, Take 1 tablet by mouth daily., Disp: , Rfl:    Allergies  Allergen Reactions   Other     Seasonal Allergies    Past Medical History:  Diagnosis Date   Headache      Past Surgical History:  Procedure Laterality Date   ADENOIDECTOMY AND MYRINGOTOMY WITH TUBE PLACEMENT  2005    Family History  Problem Relation Age of Onset   Depression Maternal Grandmother    Anxiety disorder Maternal Grandmother    Congestive Heart Failure Paternal Grandmother     Social History   Tobacco Use   Smoking status: Never   Smokeless tobacco: Never  Vaping Use   Vaping status: Former  Substance Use Topics   Alcohol use: Yes   Drug use: No     ROS Refer to HPI for ROS details.  Objective:   Vitals: BP (!) 92/58 (BP Location: Left Arm)   Pulse 77   Temp 98.5 F (36.9 C) (Oral)   Resp 16   LMP 08/30/2023 (Approximate)   SpO2 95%   Physical Exam Vitals and nursing note reviewed.  Constitutional:      General: She is not in acute distress.    Appearance: Normal appearance. She is well-developed. She is not ill-appearing or toxic-appearing.  HENT:     Head: Normocephalic.  Cardiovascular:     Rate and Rhythm: Normal rate.  Pulmonary:     Effort: Pulmonary effort is normal. No respiratory distress.  Abdominal:     General: There is no distension.     Palpations: Abdomen is soft.     Tenderness: There is no abdominal tenderness. There is no right CVA tenderness, left CVA tenderness, guarding or rebound.  Genitourinary:    Vagina: No vaginal discharge.     Comments: Patient denies any vaginal discharge, reports vaginal itching and irritation. Musculoskeletal:     Cervical back: Neck supple.  Skin:    General: Skin is warm and dry.  Neurological:     General: No focal deficit present.     Mental Status: She is alert and oriented to person, place, and time.  Psychiatric:        Mood and Affect: Mood normal.     Procedures  No results found for this or any previous visit (from the past 24  hours).  Assessment and Plan :   PDMP not reviewed this encounter.  1. Vaginitis and vulvovaginitis    Vaginal irritation (suspected vaginal Candida) -Vaginal swab collected and sent to lab testing for bacterial vaginosis, vaginal Candida, trichomonas, gonorrhea/chlamydia. -As discussed based on presentation and report of past symptoms of vaginal Candida, I suspect that symptoms are a result of yeast infection. -If symptoms persist and testing is positive take fluconazole 150 mg tablet once daily on day 1 followed by a second tablet on day 3 if symptoms persist. -If there are any change in symptoms or escalation of  current symptoms follow-up with gynecology for further evaluation and management.  Lucky Cowboy   [place], [place], [place] 09/08/23 1118

## 2023-09-08 NOTE — ED Triage Notes (Signed)
 Patient presents to the office for vaginal itching X 2 days. Patient would like STD-testing.

## 2023-09-09 LAB — CERVICOVAGINAL ANCILLARY ONLY
Bacterial Vaginitis (gardnerella): NEGATIVE
Candida Glabrata: NEGATIVE
Candida Vaginitis: POSITIVE — AB
Chlamydia: NEGATIVE
Comment: NEGATIVE
Comment: NEGATIVE
Comment: NEGATIVE
Comment: NEGATIVE
Comment: NEGATIVE
Comment: NORMAL
Neisseria Gonorrhea: NEGATIVE
Trichomonas: NEGATIVE

## 2023-10-05 ENCOUNTER — Telehealth: Admitting: Physician Assistant

## 2023-10-05 DIAGNOSIS — B3731 Acute candidiasis of vulva and vagina: Secondary | ICD-10-CM

## 2023-10-05 MED ORDER — FLUCONAZOLE 150 MG PO TABS: 150.0000 mg | ORAL_TABLET | ORAL | 0 refills | Status: DC | PRN

## 2023-10-05 NOTE — Progress Notes (Signed)

## 2023-10-06 ENCOUNTER — Ambulatory Visit (HOSPITAL_COMMUNITY)
Admission: RE | Admit: 2023-10-06 | Discharge: 2023-10-06 | Disposition: A | Discharge status: Home / Self Care | Payer: Self-pay | Source: Ambulatory Visit | Attending: Nurse Practitioner | Admitting: Nurse Practitioner

## 2023-10-06 ENCOUNTER — Encounter (HOSPITAL_COMMUNITY): Payer: Self-pay

## 2023-10-06 VITALS — BP 117/76 | HR 88 | Temp 98.5°F | Resp 16

## 2023-10-06 DIAGNOSIS — N898 Other specified noninflammatory disorders of vagina: Secondary | ICD-10-CM | POA: Diagnosis present

## 2023-10-06 DIAGNOSIS — Z113 Encounter for screening for infections with a predominantly sexual mode of transmission: Secondary | ICD-10-CM

## 2023-10-06 NOTE — ED Provider Notes (Signed)
 [facility]    CSN: [id number] Arrival date & time: 10/06/23  1726      History   Chief Complaint Chief Complaint  Patient presents with   Exposure to STD    Entered by patient    HPI Bonnie Becker is a 23 y.o. female.   Patient presents requesting evaluation for vaginal itching and discharge that has been occurring since Monday.  Denies any abnormal bleeding.  No rash or lesions are present.  Currently sexually active with 1 female partner.  They do not use consistent protection.  She does have a history of chlamydia in November 2024.  Denies any dysuria and no reported fever, abdominal pain, vomiting, or diarrhea.  Currently takes OCP.  The history is provided by the patient.  Exposure to STD Pertinent negatives include no chest pain, no abdominal pain and no shortness of breath.    Past Medical History:  Diagnosis Date   Headache     Patient Active Problem List   Diagnosis Date Noted   Hair loss 03/27/2016   Chronic daily headache 07/03/2015    Past Surgical History:  Procedure Laterality Date   ADENOIDECTOMY AND MYRINGOTOMY WITH TUBE PLACEMENT  2005    OB History   No obstetric history on file.      Home Medications    Prior to Admission medications   Medication Sig Start Date End Date Taking? Authorizing Provider  JUNEL FE 1.5/30 1.5-30 MG-MCG tablet Take 1 tablet by mouth daily. 07/03/22  Yes [provider]  fluconazole (DIFLUCAN) 150 MG tablet Take 1 tablet (150 mg total) by mouth every 3 (three) days as needed. 10/05/23   Margaretann Loveless, PA-C    Family History Family History  Problem Relation Age of Onset   Depression Maternal Grandmother    Anxiety disorder Maternal Grandmother    Congestive Heart Failure Paternal Grandmother     Social History Social History   Tobacco Use   Smoking status: Never   Smokeless tobacco: Never  Vaping Use   Vaping status: Former  Substance Use Topics   Alcohol use: Yes   Drug use: No      Allergies   Other   Review of Systems Review of Systems  Constitutional:  Negative for chills and fever.  HENT:  Negative for congestion, rhinorrhea and sore throat.   Respiratory:  Negative for cough and shortness of breath.   Cardiovascular:  Negative for chest pain and palpitations.  Gastrointestinal:  Negative for abdominal pain, diarrhea, nausea and vomiting.  Genitourinary:  Positive for vaginal discharge and vaginal pain (Itching). Negative for dysuria.  Musculoskeletal:  Negative for back pain and myalgias.  Skin:  Negative for rash.     Physical Exam Triage Vital Signs ED Triage Vitals [10/06/23 1806]  Encounter Vitals Group     BP 117/76     Systolic BP Percentile      Diastolic BP Percentile      Pulse Rate 88     Resp 16     Temp 98.5 F (36.9 C)     Temp Source Oral     SpO2 100 %     Weight      Height      Head Circumference      Peak Flow      Pain Score      Pain Loc      Pain Education      Exclude from Growth Chart    No data  found.  Updated Vital Signs BP 117/76 (BP Location: Left Arm)   Pulse 88   Temp 98.5 F (36.9 C) (Oral)   Resp 16   LMP 08/30/2023 (Approximate)   SpO2 100%   Physical Exam Vitals and nursing note reviewed.  Constitutional:      Appearance: Normal appearance.  Cardiovascular:     Rate and Rhythm: Normal rate and regular rhythm.     Heart sounds: Normal heart sounds.  Pulmonary:     Effort: Pulmonary effort is normal.     Breath sounds: Normal breath sounds.  Abdominal:     General: Bowel sounds are normal.  Genitourinary:    Comments: Pelvic exam deferred in the absence of abnormal vaginal bleeding, rash or lesions, or concern for foreign body. Skin:    General: Skin is warm and dry.  Neurological:     General: No focal deficit present.     Mental Status: She is alert and oriented to person, place, and time.  Psychiatric:        Mood and Affect: Mood normal.        Behavior: Behavior normal.         Thought Content: Thought content normal.        Judgment: Judgment normal.    UC Treatments / Results  Labs (all labs ordered are listed, but only abnormal results are displayed) Labs Reviewed  POCT URINE PREGNANCY  CERVICOVAGINAL ANCILLARY ONLY   EKG  Radiology No results found.  Procedures Procedures (including critical care time)  Medications Ordered in UC Medications - No data to display  Initial Impression / Assessment and Plan / UC Course  I have reviewed the triage vital signs and the nursing notes.  Pertinent labs & imaging results that were available during my care of the patient were reviewed by me and considered in my medical decision making (see chart for details).    Patient presents requesting evaluation for the new onset of vaginal discharge and itching.  Pending vaginitis swab/STI testing including gonorrhea, chlamydia, trichomonas, bacterial vaginitis, and yeast.  Patient is aware that we will contact her for further management if any of the test results are positive. Final Clinical Impressions(s) / UC Diagnoses   Final diagnoses:  Vaginal discharge  Screen for STD (sexually transmitted disease)     Discharge Instructions      Vaginal swab has been obtained for gonorrhea, chlamydia, bacterial vaginitis, yeast, and trichomonas.  If any of the test results are positive-we will contact you to adjust your treatment plan.    ED Prescriptions   None    PDMP not reviewed this encounter.   Burgess Estelle, FNP 10/06/23 [phone]

## 2023-10-06 NOTE — Discharge Instructions (Addendum)
 Vaginal swab has been obtained for gonorrhea, chlamydia, bacterial vaginitis, yeast, and trichomonas.  If any of the test results are positive-we will contact you to adjust your treatment plan.

## 2023-10-06 NOTE — ED Triage Notes (Signed)
 Here for STI-testing. Patient thinks she has yeast infection.

## 2023-10-08 ENCOUNTER — Telehealth (HOSPITAL_COMMUNITY): Payer: Self-pay

## 2023-10-08 ENCOUNTER — Telehealth: Admitting: Physician Assistant

## 2023-10-08 DIAGNOSIS — B3731 Acute candidiasis of vulva and vagina: Secondary | ICD-10-CM | POA: Diagnosis not present

## 2023-10-08 MED ORDER — FLUCONAZOLE 150 MG PO TABS: 150.0000 mg | ORAL_TABLET | Freq: Once | ORAL | 0 refills | Status: AC

## 2023-10-08 NOTE — Telephone Encounter (Signed)
 Pt called regarding test results. Let her know they were still in process. She will get a call if anything is abnormal and results will be in her MyChart. Pt verbalized understanding

## 2023-10-08 NOTE — Progress Notes (Signed)

## 2023-10-12 ENCOUNTER — Telehealth (HOSPITAL_COMMUNITY): Payer: Self-pay

## 2023-10-12 ENCOUNTER — Ambulatory Visit (HOSPITAL_COMMUNITY): Payer: Self-pay

## 2023-10-12 LAB — CERVICOVAGINAL ANCILLARY ONLY
Bacterial Vaginitis (gardnerella): NEGATIVE
Candida Glabrata: NEGATIVE
Candida Vaginitis: POSITIVE — AB
Chlamydia: NEGATIVE
Comment: NEGATIVE
Comment: NEGATIVE
Comment: NEGATIVE
Comment: NEGATIVE
Comment: NEGATIVE
Comment: NORMAL
Neisseria Gonorrhea: NEGATIVE
Trichomonas: NEGATIVE

## 2023-10-12 MED ORDER — FLUCONAZOLE 150 MG PO TABS: 150.0000 mg | ORAL_TABLET | Freq: Once | ORAL | 0 refills | Status: AC

## 2023-10-12 NOTE — Telephone Encounter (Signed)
Per protocol, pt requires tx with Diflucan. Attempted to reach patient x1. LVM. Rx sent to pharmacy on file.

## 2023-10-13 ENCOUNTER — Ambulatory Visit (HOSPITAL_COMMUNITY): Payer: Self-pay

## 2023-12-03 ENCOUNTER — Encounter (HOSPITAL_COMMUNITY): Payer: Self-pay

## 2023-12-03 ENCOUNTER — Ambulatory Visit (HOSPITAL_COMMUNITY)
Admission: RE | Admit: 2023-12-03 | Discharge: 2023-12-03 | Disposition: A | Discharge status: Home / Self Care | Payer: Self-pay | Source: Ambulatory Visit | Attending: Internal Medicine | Admitting: Internal Medicine

## 2023-12-03 VITALS — BP 124/83 | HR 91 | Temp 98.8°F | Resp 16

## 2023-12-03 DIAGNOSIS — Z202 Contact with and (suspected) exposure to infections with a predominantly sexual mode of transmission: Secondary | ICD-10-CM | POA: Diagnosis present

## 2023-12-03 DIAGNOSIS — Z9189 Other specified personal risk factors, not elsewhere classified: Secondary | ICD-10-CM | POA: Diagnosis present

## 2023-12-03 NOTE — ED Provider Notes (Signed)
 [facility]    CSN: [id number] Arrival date & time: 12/03/23  1725      History   Chief Complaint Chief Complaint  Patient presents with   Exposure to STD    Just a check up - Entered by patient    HPI Bonnie Becker is a 23 y.o. female.   Bonnie Becker is a 23 y.o. female presenting to urgent care requesting STI testing.  Currently asymptomatic and without recent known STI exposure.  Sexually active with single female partner(s) without protection. Denies urinary symptoms, N/V/D, pelvic/abdominal pain, new low back pain, fever/chills.  No vaginal discharge, odor, or itch.  LMP 11/15/2023. does not use contraception.    Exposure to STD    Past Medical History:  Diagnosis Date   Headache     Patient Active Problem List   Diagnosis Date Noted   Hair loss 03/27/2016   Chronic daily headache 07/03/2015    Past Surgical History:  Procedure Laterality Date   ADENOIDECTOMY AND MYRINGOTOMY WITH TUBE PLACEMENT  2005    OB History   No obstetric history on file.      Home Medications    Prior to Admission medications   Medication Sig Start Date End Date Taking? Authorizing Provider  JUNEL FE 1.5/30 1.5-30 MG-MCG tablet Take 1 tablet by mouth daily. 07/03/22   [provider]    Family History Family History  Problem Relation Age of Onset   Depression Maternal Grandmother    Anxiety disorder Maternal Grandmother    Congestive Heart Failure Paternal Grandmother     Social History Social History   Tobacco Use   Smoking status: Never   Smokeless tobacco: Never  Vaping Use   Vaping status: Former  Substance Use Topics   Alcohol use: Yes   Drug use: No     Allergies   Other   Review of Systems Review of Systems Per HPI  Physical Exam Triage Vital Signs ED Triage Vitals  Encounter Vitals Group     BP 12/03/23 1737 124/83     Systolic BP Percentile --      Diastolic BP Percentile --      Pulse Rate 12/03/23 1737 91     Resp  12/03/23 1737 16     Temp 12/03/23 1737 98.8 F (37.1 C)     Temp Source 12/03/23 1737 Oral     SpO2 12/03/23 1737 98 %     Weight --      Height --      Head Circumference --      Peak Flow --      Pain Score 12/03/23 1736 0     Pain Loc --      Pain Education --      Exclude from Growth Chart --    No data found.  Updated Vital Signs BP 124/83 (BP Location: Left Arm)   Pulse 91   Temp 98.8 F (37.1 C) (Oral)   Resp 16   LMP 11/15/2023 (Approximate)   SpO2 98%   Visual Acuity Right Eye Distance:   Left Eye Distance:   Bilateral Distance:    Right Eye Near:   Left Eye Near:    Bilateral Near:     Physical Exam Vitals and nursing note reviewed.  Constitutional:      Appearance: She is not ill-appearing or toxic-appearing.  HENT:     Head: Normocephalic and atraumatic.     Right Ear: Hearing and external ear  normal.     Left Ear: Hearing and external ear normal.     Nose: Nose normal.     Mouth/Throat:     Lips: Pink.  Eyes:     General: Lids are normal. Vision grossly intact. Gaze aligned appropriately.     Extraocular Movements: Extraocular movements intact.     Conjunctiva/sclera: Conjunctivae normal.  Pulmonary:     Effort: Pulmonary effort is normal.  Musculoskeletal:     Cervical back: Neck supple.  Skin:    General: Skin is warm and dry.     Capillary Refill: Capillary refill takes less than 2 seconds.     Findings: No rash.  Neurological:     General: No focal deficit present.     Mental Status: She is alert and oriented to person, place, and time. Mental status is at baseline.     Cranial Nerves: No dysarthria or facial asymmetry.  Psychiatric:        Mood and Affect: Mood normal.        Speech: Speech normal.        Behavior: Behavior normal.        Thought Content: Thought content normal.        Judgment: Judgment normal.      UC Treatments / Results  Labs (all labs ordered are listed, but only abnormal results are displayed) Labs  Reviewed  CERVICOVAGINAL ANCILLARY ONLY    EKG   Radiology No results found.  Procedures Procedures (including critical care time)  Medications Ordered in UC Medications - No data to display  Initial Impression / Assessment and Plan / UC Course  I have reviewed the triage vital signs and the nursing notes.  Pertinent labs & imaging results that were available during my care of the patient were reviewed by me and considered in my medical decision making (see chart for details).   1. Possible exposure to STD, at risk for STD due to unprotected sex STI labs pending, will notify patient of positive results and treat accordingly per protocol when labs result.  Patient declines HIV and syphilis testing today.   Patient to avoid sexual intercourse until screening testing comes back.   Education provided regarding safe sexual practices and patient encouraged to use protection to prevent spread of STIs.    Counseled patient on potential for adverse effects with medications prescribed/recommended today, strict ER and return-to-clinic precautions discussed, patient verbalized understanding.    Final Clinical Impressions(s) / UC Diagnoses   Final diagnoses:  Possible exposure to STD  At risk for sexually transmitted disease due to unprotected sex     Discharge Instructions      STD testing pending, this will take 2-3 days to result. We will only call you if your testing is positive for any infection(s) and we will provide treatment.  Avoid sexual intercourse until your STD results come back.  If any of your STD results are positive, you will need to avoid sexual intercourse for 7 days while you are being treated to prevent spread of STD.  Condom use is the best way to prevent spread of STDs. Notify partner(s) of any positive results.  Return to urgent care as needed.    ED Prescriptions   None    PDMP not reviewed this encounter.   Starlene Eaton, [place] 12/03/23  1810

## 2023-12-03 NOTE — Discharge Instructions (Signed)

## 2023-12-03 NOTE — ED Triage Notes (Signed)
 Patient here today to have Stds testing.

## 2023-12-06 LAB — CERVICOVAGINAL ANCILLARY ONLY
Chlamydia: NEGATIVE
Comment: NEGATIVE
Comment: NEGATIVE
Comment: NORMAL
Neisseria Gonorrhea: NEGATIVE
Trichomonas: NEGATIVE

## 2023-12-13 ENCOUNTER — Telehealth: Admitting: Family Medicine

## 2023-12-13 DIAGNOSIS — N760 Acute vaginitis: Secondary | ICD-10-CM

## 2023-12-13 DIAGNOSIS — B9689 Other specified bacterial agents as the cause of diseases classified elsewhere: Secondary | ICD-10-CM

## 2023-12-13 MED ORDER — METRONIDAZOLE 500 MG PO TABS: 500.0000 mg | ORAL_TABLET | Freq: Two times a day (BID) | ORAL | 0 refills | Status: AC

## 2023-12-13 NOTE — Progress Notes (Signed)

## 2023-12-21 ENCOUNTER — Ambulatory Visit (HOSPITAL_COMMUNITY): Payer: Self-pay

## 2023-12-23 ENCOUNTER — Telehealth: Admitting: Physician Assistant

## 2023-12-23 DIAGNOSIS — T3695XA Adverse effect of unspecified systemic antibiotic, initial encounter: Secondary | ICD-10-CM

## 2023-12-23 DIAGNOSIS — B379 Candidiasis, unspecified: Secondary | ICD-10-CM

## 2023-12-23 MED ORDER — FLUCONAZOLE 150 MG PO TABS: ORAL_TABLET | ORAL | 0 refills | Status: DC

## 2023-12-23 NOTE — Progress Notes (Signed)
 I have spent 5 minutes in review of e-visit questionnaire, review and updating patient chart, medical decision making and response to patient.   Piedad Climes, PA-C

## 2023-12-23 NOTE — Progress Notes (Signed)

## 2024-01-18 ENCOUNTER — Telehealth: Admitting: Family Medicine

## 2024-01-18 DIAGNOSIS — T3695XA Adverse effect of unspecified systemic antibiotic, initial encounter: Secondary | ICD-10-CM

## 2024-01-18 DIAGNOSIS — B3731 Acute candidiasis of vulva and vagina: Secondary | ICD-10-CM

## 2024-01-18 DIAGNOSIS — B379 Candidiasis, unspecified: Secondary | ICD-10-CM | POA: Diagnosis not present

## 2024-01-18 MED ORDER — FLUCONAZOLE 150 MG PO TABS: ORAL_TABLET | ORAL | 0 refills | Status: DC

## 2024-01-18 NOTE — Progress Notes (Signed)

## 2024-01-19 ENCOUNTER — Ambulatory Visit (HOSPITAL_COMMUNITY)
Admission: RE | Admit: 2024-01-19 | Discharge: 2024-01-19 | Disposition: A | Discharge status: Home / Self Care | Payer: Self-pay | Source: Ambulatory Visit

## 2024-01-19 ENCOUNTER — Encounter (HOSPITAL_COMMUNITY): Payer: Self-pay

## 2024-01-19 VITALS — BP 119/80 | HR 92 | Temp 98.3°F | Resp 15

## 2024-01-19 DIAGNOSIS — N898 Other specified noninflammatory disorders of vagina: Secondary | ICD-10-CM | POA: Insufficient documentation

## 2024-01-19 DIAGNOSIS — Z3201 Encounter for pregnancy test, result positive: Secondary | ICD-10-CM | POA: Diagnosis not present

## 2024-01-19 LAB — POCT URINE PREGNANCY: Preg Test, Ur: NEGATIVE

## 2024-01-19 NOTE — ED Triage Notes (Signed)
 Pt reports some vaginal itching and odor that started yesterday. Pt requesting STD testing. Denies any known exposure.

## 2024-01-19 NOTE — ED Provider Notes (Signed)
 [facility]  Note:  This document was prepared using Dragon voice recognition software and may include unintentional dictation errors.  MRN: [id number] DOB: 04-Oct-2000  Subjective:   Bonnie Becker is a 23 y.o. female presenting for vaginal discharge, vaginal itching and irritation and vaginal odor x 2 days.  Patient reports that symptoms came on suddenly she has past history of bacterial vaginosis.  Patient would like STD screening as well based on her symptoms.  Patient denies any known STD exposure.  No dysuria, no increased urinary frequency, no abdominal or flank pain.  No current facility-administered medications for this encounter.  Current Outpatient Medications:    fluconazole  (DIFLUCAN ) 150 MG tablet, Take 1 tablet PO once. Repeat in 3 days if needed., Disp: 2 tablet, Rfl: 0   JUNEL FE 1.5/30 1.5-30 MG-MCG tablet, Take 1 tablet by mouth daily., Disp: , Rfl:    Allergies  Allergen Reactions   Other     Seasonal Allergies    Past Medical History:  Diagnosis Date   Headache      Past Surgical History:  Procedure Laterality Date   ADENOIDECTOMY AND MYRINGOTOMY WITH TUBE PLACEMENT  2005    Family History  Problem Relation Age of Onset   Depression Maternal Grandmother    Anxiety disorder Maternal Grandmother    Congestive Heart Failure Paternal Grandmother     Social History   Tobacco Use   Smoking status: Never   Smokeless tobacco: Never  Vaping Use   Vaping status: Former  Substance Use Topics   Alcohol use: Yes   Drug use: No    ROS Refer to HPI for ROS details.  Objective:   Vitals: BP 119/80 (BP Location: Left Arm)   Pulse 92   Temp 98.3 F (36.8 C) (Oral)   Resp 15   LMP 01/10/2024 (Exact Date)   SpO2 98%   Physical Exam Vitals and nursing note reviewed.  Constitutional:      General: She is not in acute distress.    Appearance: She is well-developed. She is not ill-appearing or toxic-appearing.  HENT:     Head:  Normocephalic and atraumatic.  Cardiovascular:     Rate and Rhythm: Normal rate.  Pulmonary:     Effort: Pulmonary effort is normal. No respiratory distress.  Abdominal:     Palpations: Abdomen is soft.     Tenderness: There is no abdominal tenderness. There is no right CVA tenderness or left CVA tenderness.  Genitourinary:    Vagina: Vaginal discharge present.  Skin:    General: Skin is warm and dry.  Neurological:     General: No focal deficit present.     Mental Status: She is alert and oriented to person, place, and time.  Psychiatric:        Mood and Affect: Mood normal.        Behavior: Behavior normal.     Procedures  Results for orders placed or performed during the hospital encounter of 01/19/24 (from the past 24 hours)  POCT urine pregnancy     Status: None   Collection Time: 01/19/24  6:05 PM  Result Value Ref Range   Preg Test, Ur Negative Negative    No results found.   Assessment and Plan :     Discharge Instructions       1. Vaginal discharge (Primary) - POCT urine pregnancy completed UC is negative for pregnancy - Cervicovaginal swab collected in UC and sent to lab for further testing results should  be available in 2 to 3 days. - If any abnormal findings with final report patient will be contacted appropriate treatment provided - Continue to monitor for any symptoms if you develop any symptoms of STD or any other concerning symptoms follow-up in ER for further evaluation and management.       Nickalas Mccarrick B Collyn Ribas   Shyra Emile, [place], [place] 01/19/24 1806

## 2024-01-19 NOTE — Discharge Instructions (Addendum)
  1. Vaginal discharge (Primary) - POCT urine pregnancy completed UC is negative for pregnancy - Cervicovaginal swab collected in UC and sent to lab for further testing results should be available in 2 to 3 days. - If any abnormal findings with final report patient will be contacted appropriate treatment provided - Continue to monitor for any symptoms if you develop any symptoms of STD or any other concerning symptoms follow-up in ER for further evaluation and management.

## 2024-01-20 ENCOUNTER — Ambulatory Visit (HOSPITAL_COMMUNITY): Payer: Self-pay

## 2024-01-20 LAB — CERVICOVAGINAL ANCILLARY ONLY
Bacterial Vaginitis (gardnerella): POSITIVE — AB
Candida Glabrata: NEGATIVE
Candida Vaginitis: POSITIVE — AB
Chlamydia: NEGATIVE
Comment: NEGATIVE
Comment: NEGATIVE
Comment: NEGATIVE
Comment: NEGATIVE
Comment: NEGATIVE
Comment: NORMAL
Neisseria Gonorrhea: NEGATIVE
Trichomonas: NEGATIVE

## 2024-01-20 MED ORDER — METRONIDAZOLE 500 MG PO TABS: 500.0000 mg | ORAL_TABLET | Freq: Two times a day (BID) | ORAL | 0 refills | Status: AC

## 2024-01-20 MED ORDER — FLUCONAZOLE 150 MG PO TABS: 150.0000 mg | ORAL_TABLET | Freq: Once | ORAL | 0 refills | Status: AC

## 2024-03-15 ENCOUNTER — Telehealth: Payer: Self-pay | Admitting: Physician Assistant

## 2024-03-15 ENCOUNTER — Ambulatory Visit (HOSPITAL_COMMUNITY): Payer: Self-pay

## 2024-03-15 DIAGNOSIS — B3731 Acute candidiasis of vulva and vagina: Secondary | ICD-10-CM

## 2024-03-15 DIAGNOSIS — B9689 Other specified bacterial agents as the cause of diseases classified elsewhere: Secondary | ICD-10-CM

## 2024-03-15 DIAGNOSIS — N760 Acute vaginitis: Secondary | ICD-10-CM

## 2024-03-15 MED ORDER — FLUCONAZOLE 150 MG PO TABS: 150.0000 mg | ORAL_TABLET | ORAL | 0 refills | Status: DC | PRN

## 2024-03-15 MED ORDER — METRONIDAZOLE 500 MG PO TABS: 500.0000 mg | ORAL_TABLET | Freq: Two times a day (BID) | ORAL | 0 refills | Status: AC

## 2024-03-15 NOTE — Progress Notes (Signed)
 E-Visit for Vaginal Symptoms  We are sorry that you are not feeling well. Here is how we plan to help! Based on what you shared with me it looks like you: May have a vaginosis due to bacteria  Vaginosis is an inflammation of the vagina that can result in discharge, itching and pain. The cause is usually a change in the normal balance of vaginal bacteria or an infection. Vaginosis can also result from reduced estrogen levels after menopause.  The most common causes of vaginosis are:   Bacterial vaginosis which results from an overgrowth of one on several organisms that are normally present in your vagina.   Yeast infections which are caused by a naturally occurring fungus called candida.   Vaginal atrophy (atrophic vaginosis) which results from the thinning of the vagina from reduced estrogen levels after menopause.   Trichomoniasis which is caused by a parasite and is commonly transmitted by sexual intercourse.  Factors that increase your risk of developing vaginosis include: Medications, such as antibiotics and steroids Uncontrolled diabetes Use of hygiene products such as bubble bath, vaginal spray or vaginal deodorant Douching Wearing damp or tight-fitting clothing Using an intrauterine device (IUD) for birth control Hormonal changes, such as those associated with pregnancy, birth control pills or menopause Sexual activity Having a sexually transmitted infection  Your treatment plan is Metronidazole or Flagyl 500mg  twice a day for 7 days.  I have electronically sent this prescription into the pharmacy that you have chosen.  Diflucan given as prophylaxis as patient tends to get vaginal yeast infections with antibiotic use.  Be sure to take all of the medication as directed. Stop taking any medication if you develop a rash, tongue swelling or shortness of breath. Mothers who are breast feeding should consider pumping and discarding their breast milk while on these antibiotics.  However, there is no consensus that infant exposure at these doses would be harmful.  Remember that medication creams can weaken latex condoms. Marland Kitchen   HOME CARE:  Good hygiene may prevent some types of vaginosis from recurring and may relieve some symptoms:  Avoid baths, hot tubs and whirlpool spas. Rinse soap from your outer genital area after a shower, and dry the area well to prevent irritation. Don't use scented or harsh soaps, such as those with deodorant or antibacterial action. Avoid irritants. These include scented tampons and pads. Wipe from front to back after using the toilet. Doing so avoids spreading fecal bacteria to your vagina.  Other things that may help prevent vaginosis include:  Don't douche. Your vagina doesn't require cleansing other than normal bathing. Repetitive douching disrupts the normal organisms that reside in the vagina and can actually increase your risk of vaginal infection. Douching won't clear up a vaginal infection. Use a latex condom. Both female and female latex condoms may help you avoid infections spread by sexual contact. Wear cotton underwear. Also wear pantyhose with a cotton crotch. If you feel comfortable without it, skip wearing underwear to bed. Yeast thrives in [employer] Your symptoms should improve in the next day or two.  GET HELP RIGHT AWAY IF:  You have pain in your lower abdomen ( pelvic area or over your ovaries) You develop nausea or vomiting You develop a fever Your discharge changes or worsens You have persistent pain with intercourse You develop shortness of breath, a rapid pulse, or you faint.  These symptoms could be signs of problems or infections that need to be evaluated by a medical provider now.  MAKE SURE YOU   Understand these instructions. Will watch your condition. Will get help right away if you are not doing well or get worse.  Thank you for choosing an e-visit.  Your e-visit answers were reviewed by a  board certified advanced clinical practitioner to complete your personal care plan. Depending upon the condition, your plan could have included both over the counter or prescription medications.  Please review your pharmacy choice. Make sure the pharmacy is open so you can pick up prescription now. If there is a problem, you may contact your provider through [employer] and have the prescription routed to another pharmacy.  Your safety is important to [place]. If you have drug allergies check your prescription carefully.   For the next 24 hours you can use MyChart to ask questions about today's visit, request a non-urgent call back, or ask for a work or school excuse. You will get an email in the next two days asking about your experience. I hope that your e-visit has been valuable and will speed your recovery.    I have spent 5 minutes in review of e-visit questionnaire, review and updating patient chart, medical decision making and response to patient.   Margaretann Loveless, PA-C

## 2024-03-16 ENCOUNTER — Telehealth: Payer: Self-pay | Admitting: Family

## 2024-03-16 DIAGNOSIS — N898 Other specified noninflammatory disorders of vagina: Secondary | ICD-10-CM

## 2024-03-16 NOTE — Progress Notes (Signed)
  Because of your recurrent vaginal discharge, I feel your condition warrants further evaluation and I recommend that you be seen in a face-to-face visit.   NOTE: There will be NO CHARGE for this E-Visit   If you are having a true medical emergency, please call 911.     For an urgent face to face visit, [place] has multiple urgent care centers for your convenience.  Click the link below for the full list of locations and hours, walk-in wait times, appointment scheduling options and driving directions:  Urgent Care - [place], [place], [place], [place], [place], [place]  [place]     Your MyChart E-visit questionnaire answers were reviewed by a board certified advanced clinical practitioner to complete your personal care plan based on your specific symptoms.    Thank you for using e-Visits.

## 2024-03-17 ENCOUNTER — Ambulatory Visit (HOSPITAL_COMMUNITY): Payer: Self-pay

## 2024-04-21 ENCOUNTER — Ambulatory Visit (HOSPITAL_COMMUNITY): Payer: Self-pay

## 2024-04-22 ENCOUNTER — Ambulatory Visit (HOSPITAL_COMMUNITY): Payer: Self-pay

## 2024-04-23 ENCOUNTER — Ambulatory Visit (HOSPITAL_COMMUNITY): Payer: Self-pay

## 2024-04-24 ENCOUNTER — Encounter (HOSPITAL_COMMUNITY): Payer: Self-pay

## 2024-04-24 ENCOUNTER — Ambulatory Visit (HOSPITAL_COMMUNITY)
Admission: RE | Admit: 2024-04-24 | Discharge: 2024-04-24 | Disposition: A | Discharge status: Home / Self Care | Payer: Self-pay | Source: Ambulatory Visit

## 2024-04-24 VITALS — BP 122/79 | HR 78 | Temp 98.3°F | Resp 16

## 2024-04-24 DIAGNOSIS — N760 Acute vaginitis: Secondary | ICD-10-CM | POA: Insufficient documentation

## 2024-04-24 NOTE — ED Triage Notes (Signed)
 Pt states she has white vaginal discharge X 2 weeks. She would like STI testing she only wants cyto today.

## 2024-04-24 NOTE — ED Provider Notes (Signed)
 [facility]    CSN: [id number] Arrival date & time: 04/24/24  1127      History   Chief Complaint Chief Complaint  Patient presents with   SEXUALLY TRANSMITTED DISEASE   Vaginal Discharge    HPI Bonnie Becker is a 23 y.o. female.   Patient presents today due to 2 weeks worth of whitish vaginal discharge.  Patient is unable to articulate if it is thick or thin.  Patient states that she is unsure if it is what her normal vaginal discharge looks like.  Patient is concerned about STIs.  Patient denies vaginal irritation, vaginal itching, malodorous vaginal discharge, lower abdominal pain, or back pain.  The history is provided by the patient.  Vaginal Discharge   Past Medical History:  Diagnosis Date   Headache     Patient Active Problem List   Diagnosis Date Noted   Hair loss 03/27/2016   Chronic daily headache 07/03/2015    Past Surgical History:  Procedure Laterality Date   ADENOIDECTOMY AND MYRINGOTOMY WITH TUBE PLACEMENT  2005    OB History   No obstetric history on file.      Home Medications    Prior to Admission medications   Medication Sig Start Date End Date Taking? Authorizing Provider  JUNEL FE 1.5/30 1.5-30 MG-MCG tablet Take 1 tablet by mouth daily. 07/03/22  Yes [provider]  fluconazole  (DIFLUCAN ) 150 MG tablet Take 1 tablet (150 mg total) by mouth every 3 (three) days as needed. 03/15/24   Vivienne Delon HERO, PA-C    Family History Family History  Problem Relation Age of Onset   Depression Maternal Grandmother    Anxiety disorder Maternal Grandmother    Congestive Heart Failure Paternal Grandmother     Social History Social History   Tobacco Use   Smoking status: Never   Smokeless tobacco: Never  Vaping Use   Vaping status: Former  Substance Use Topics   Alcohol use: Yes   Drug use: No     Allergies   Other   Review of Systems Review of Systems  Genitourinary:  Positive for vaginal discharge.      Physical Exam Triage Vital Signs ED Triage Vitals  Encounter Vitals Group     BP 04/24/24 1209 122/79     Girls Systolic BP Percentile --      Girls Diastolic BP Percentile --      Boys Systolic BP Percentile --      Boys Diastolic BP Percentile --      Pulse Rate 04/24/24 1209 78     Resp 04/24/24 1209 16     Temp 04/24/24 1209 98.3 F (36.8 C)     Temp Source 04/24/24 1209 Oral     SpO2 04/24/24 1209 98 %     Weight --      Height --      Head Circumference --      Peak Flow --      Pain Score 04/24/24 1208 0     Pain Loc --      Pain Education --      Exclude from Growth Chart --    No data found.  Updated Vital Signs BP 122/79 (BP Location: Right Arm)   Pulse 78   Temp 98.3 F (36.8 C) (Oral)   Resp 16   LMP 04/03/2024 (Exact Date)   SpO2 98%   Visual Acuity Right Eye Distance:   Left Eye Distance:   Bilateral Distance:  Right Eye Near:   Left Eye Near:    Bilateral Near:     Physical Exam Vitals and nursing note reviewed.  Constitutional:      General: She is not in acute distress.    Appearance: Normal appearance. She is not ill-appearing, toxic-appearing or diaphoretic.  Eyes:     General: No scleral icterus. Cardiovascular:     Rate and Rhythm: Normal rate and regular rhythm.     Heart sounds: Normal heart sounds.  Pulmonary:     Effort: Pulmonary effort is normal. No respiratory distress.     Breath sounds: Normal breath sounds. No wheezing or rhonchi.  Abdominal:     General: Abdomen is flat. Bowel sounds are normal.     Palpations: Abdomen is soft.     Tenderness: There is no abdominal tenderness. There is no right CVA tenderness or left CVA tenderness.  Skin:    General: Skin is warm.  Neurological:     Mental Status: She is alert and oriented to person, place, and time.  Psychiatric:        Mood and Affect: Mood normal.        Behavior: Behavior normal.      UC Treatments / Results  Labs (all labs ordered are listed, but  only abnormal results are displayed) Labs Reviewed  CERVICOVAGINAL ANCILLARY ONLY    EKG   Radiology No results found.  Procedures Procedures (including critical care time)  Medications Ordered in UC Medications - No data to display  Initial Impression / Assessment and Plan / UC Course  I have reviewed the triage vital signs and the nursing notes.  Pertinent labs & imaging results that were available during my care of the patient were reviewed by me and considered in my medical decision making (see chart for details).     School exam unremarkable will test patient for gonorrhea, chlamydia, trichomonas, bacterial vaginosis, and yeast Final Clinical Impressions(s) / UC Diagnoses   Final diagnoses:  Acute vaginitis   Discharge Instructions   None    ED Prescriptions   None    PDMP not reviewed this encounter.   Andra Corean BROCKS, PA-C 04/24/24 1239

## 2024-04-25 LAB — CERVICOVAGINAL ANCILLARY ONLY
Bacterial Vaginitis (gardnerella): NEGATIVE
Candida Glabrata: NEGATIVE
Candida Vaginitis: NEGATIVE
Chlamydia: NEGATIVE
Comment: NEGATIVE
Comment: NEGATIVE
Comment: NEGATIVE
Comment: NEGATIVE
Comment: NEGATIVE
Comment: NORMAL
Neisseria Gonorrhea: NEGATIVE
Trichomonas: NEGATIVE

## 2024-05-23 ENCOUNTER — Telehealth: Payer: Self-pay | Admitting: Physician Assistant

## 2024-05-23 DIAGNOSIS — B3731 Acute candidiasis of vulva and vagina: Secondary | ICD-10-CM

## 2024-05-23 MED ORDER — FLUCONAZOLE 150 MG PO TABS: 150.0000 mg | ORAL_TABLET | ORAL | 0 refills | Status: AC | PRN

## 2024-05-23 NOTE — Progress Notes (Signed)

## 2024-06-11 ENCOUNTER — Telehealth: Payer: Self-pay | Admitting: Family

## 2024-06-11 DIAGNOSIS — N898 Other specified noninflammatory disorders of vagina: Secondary | ICD-10-CM

## 2024-06-11 NOTE — Progress Notes (Signed)
°  Because you were recently treated for vaginal, I feel your condition warrants further evaluation and I recommend that you be seen in a face-to-face visit.   NOTE: There will be NO CHARGE for this E-Visit   If you are having a true medical emergency, please call 911.     For an urgent face to face visit, [place] has multiple urgent care centers for your convenience.  Click the link below for the full list of locations and hours, walk-in wait times, appointment scheduling options and driving directions:  Urgent Care - [place], [place], [place], [place], [place], [place]  [place]     Your MyChart E-visit questionnaire answers were reviewed by a board certified advanced clinical practitioner to complete your personal care plan based on your specific symptoms.    Thank you for using e-Visits.

## 2024-08-05 ENCOUNTER — Ambulatory Visit (HOSPITAL_COMMUNITY): Payer: Self-pay
